# Patient Record
Sex: Male | Born: 1985 | Race: White | Hispanic: No | Marital: Married | Smoking: Former smoker
Health system: Southern US, Community
[De-identification: ages and names within clinical notes are randomized; demographics above are authoritative.]

## PROBLEM LIST (undated history)

## (undated) DIAGNOSIS — B019 Varicella without complication: Secondary | ICD-10-CM

## (undated) HISTORY — DX: Hemochromatosis, unspecified: E83.119

## (undated) HISTORY — DX: Varicella without complication: B01.9

## (undated) HISTORY — PX: KNEE SURGERY: SHX244

## (undated) HISTORY — PX: HAND SURGERY: SHX662

---
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
## 2006-11-03 ENCOUNTER — Emergency Department (HOSPITAL_COMMUNITY): Admission: EM | Admit: 2006-11-03 | Discharge: 2006-11-03 | Payer: Self-pay | Admitting: Emergency Medicine

## 2006-11-23 ENCOUNTER — Ambulatory Visit (HOSPITAL_COMMUNITY): Admission: RE | Admit: 2006-11-23 | Discharge: 2006-11-23 | Payer: Self-pay | Admitting: Orthopedic Surgery

## 2011-09-18 ENCOUNTER — Emergency Department (HOSPITAL_BASED_OUTPATIENT_CLINIC_OR_DEPARTMENT_OTHER)
Admission: EM | Admit: 2011-09-18 | Discharge: 2011-09-19 | Disposition: A | Discharge status: Home / Self Care | Payer: 59 | Attending: Emergency Medicine | Admitting: Emergency Medicine

## 2011-09-18 ENCOUNTER — Encounter: Payer: Self-pay | Admitting: *Deleted

## 2011-09-18 DIAGNOSIS — R1031 Right lower quadrant pain: Secondary | ICD-10-CM | POA: Insufficient documentation

## 2011-09-18 DIAGNOSIS — R110 Nausea: Secondary | ICD-10-CM | POA: Insufficient documentation

## 2011-09-18 MED ORDER — SODIUM CHLORIDE 0.9 % IV SOLN
Freq: Once | INTRAVENOUS | Status: AC
  Administered 2011-09-19: via INTRAVENOUS

## 2011-09-18 NOTE — ED Provider Notes (Signed)
Scribed for [name], MD, the patient was seen in room MH05/MH05 . This chart was scribed by Ellie Lunch.   CSN: [id number] Arrival date & time: 09/18/2011 11:40 PM   First MD Initiated Contact with Patient 09/18/11 2348      Chief Complaint  Patient presents with  . Abdominal Pain    (Consider location/radiation/quality/duration/timing/severity/associated sxs/prior treatment) Patient is a 25 y.o. male presenting with abdominal pain. The history is provided by the patient. No language interpreter was used.  Abdominal Pain The primary symptoms of the illness include abdominal pain and nausea. The primary symptoms of the illness do not include fever, shortness of breath, vomiting, diarrhea or dysuria. The current episode started more than 2 days ago. The onset of the illness was gradual. The problem has been gradually worsening.  The abdominal pain is located in the RLQ. The abdominal pain does not radiate. Pain scale: at worst 10/10 currently 2/10. The abdominal pain is relieved by nothing.  Symptoms associated with the illness do not include chills or hematuria.   Pt seen at 11:50 PM Chase Roy is a 25 y.o. male who presents to the Emergency Department complaining of 3 days of diffuse abdominal pain that became focused at RLQ yesterday. Pain waxes and wanes. Currently 2/10 at worst on palpation 10/10. Described as stabbing pain at worst. C/o some nausea on palpation, deep breathing, or stretching. Denies vomiting, fever, chills, loss of appetite, hematuria, dysuria, or SOB. Pt denies any recent heavy lifting.   History reviewed. No pertinent past medical history.  Past Surgical History  Procedure Date  . Knee surgery   . Tonsillectomy   . Hand surgery     History reviewed. No pertinent family history.  History  Substance Use Topics  . Smoking status: Never Smoker   . Smokeless tobacco: Not on file  . Alcohol Use: Yes     Review of Systems  Constitutional:  Negative for fever and chills.  Respiratory: Negative for shortness of breath.   Gastrointestinal: Positive for nausea and abdominal pain. Negative for vomiting and diarrhea.  Genitourinary: Negative for dysuria and hematuria.  All other systems reviewed and are negative.    Allergies  Review of patient's allergies indicates not on file.  Home Medications  No current outpatient prescriptions on file.  BP 151/97  Pulse 72  Temp(Src) 98.1 F (36.7 C) (Oral)  Resp 18  Ht 5\' 8"  (1.727 m)  Wt 155 lb (70.308 kg)  BMI 23.57 kg/m2  SpO2 99%  Physical Exam  Nursing note and vitals reviewed. Constitutional: He is oriented to person, place, and time. He appears well-developed and well-nourished.  HENT:  Head: Normocephalic and atraumatic.  Eyes: EOM are normal. Pupils are equal, round, and reactive to light.  Neck: Normal range of motion. Neck supple.  Cardiovascular: Normal rate, regular rhythm, normal heart sounds and intact distal pulses.   Pulmonary/Chest: Effort normal and breath sounds normal. No respiratory distress.  Abdominal: Soft. Bowel sounds are normal. There is tenderness. Rebound: moderate to severe RLQ tenderness.  Musculoskeletal: Normal range of motion.  Neurological: He is alert and oriented to person, place, and time.  Skin: Skin is warm and dry.  Psychiatric: He has a normal mood and affect.    ED Course  Procedures (including critical care time) OTHER DATA REVIEWED: Nursing notes, vital signs, and past medical records reviewed.   DIAGNOSTIC STUDIES: Oxygen Saturation is 99% on room air, normal by my interpretation.    Results for  orders placed during the hospital encounter of 09/18/11  URINALYSIS, ROUTINE W REFLEX MICROSCOPIC      Component Value Range   Color, Urine YELLOW  YELLOW    Appearance CLOUDY (*) CLEAR    Specific Gravity, Urine 1.022  1.005 - 1.030    pH 6.5  5.0 - 8.0    Glucose, UA NEGATIVE  NEGATIVE (mg/dL)   Hgb urine dipstick  NEGATIVE  NEGATIVE    Bilirubin Urine NEGATIVE  NEGATIVE    Ketones, ur NEGATIVE  NEGATIVE (mg/dL)   Protein, ur NEGATIVE  NEGATIVE (mg/dL)   Urobilinogen, UA 0.2  0.0 - 1.0 (mg/dL)   Nitrite NEGATIVE  NEGATIVE    Leukocytes, UA NEGATIVE  NEGATIVE   CBC      Component Value Range   WBC 7.2  4.0 - 10.5 (K/uL)   RBC 5.37  4.22 - 5.81 (MIL/uL)   Hemoglobin 16.9  13.0 - 17.0 (g/dL)   HCT 45.4  09.8 - 11.9 (%)   MCV 85.7  78.0 - 100.0 (fL)   MCH 31.5  26.0 - 34.0 (pg)   MCHC 36.7 (*) 30.0 - 36.0 (g/dL)   RDW 14.7  82.9 - 56.2 (%)   Platelets 159  150 - 400 (K/uL)  DIFFERENTIAL      Component Value Range   Neutrophils Relative 37 (*) 43 - 77 (%)   Neutro Abs 2.7  1.7 - 7.7 (K/uL)   Lymphocytes Relative 50 (*) 12 - 46 (%)   Lymphs Abs 3.6  0.7 - 4.0 (K/uL)   Monocytes Relative 10  3 - 12 (%)   Monocytes Absolute 0.8  0.1 - 1.0 (K/uL)   Eosinophils Relative 2  0 - 5 (%)   Eosinophils Absolute 0.1  0.0 - 0.7 (K/uL)   Basophils Relative 0  0 - 1 (%)   Basophils Absolute 0.0  0.0 - 0.1 (K/uL)  COMPREHENSIVE METABOLIC PANEL      Component Value Range   Sodium 139  135 - 145 (mEq/L)   Potassium 3.3 (*) 3.5 - 5.1 (mEq/L)   Chloride 101  96 - 112 (mEq/L)   CO2 29  19 - 32 (mEq/L)   Glucose, Bld 85  70 - 99 (mg/dL)   BUN 15  6 - 23 (mg/dL)   Creatinine, Ser 1.30  0.50 - 1.35 (mg/dL)   Calcium 86.5  8.4 - 10.5 (mg/dL)   Total Protein 7.8  6.0 - 8.3 (g/dL)   Albumin 4.4  3.5 - 5.2 (g/dL)   AST 25  0 - 37 (U/L)   ALT 49  0 - 53 (U/L)   Alkaline Phosphatase 104  39 - 117 (U/L)   Total Bilirubin 0.4  0.3 - 1.2 (mg/dL)   GFR calc non Af Amer >90  >90 (mL/min)   GFR calc Af Amer >90  >90 (mL/min)   Ct Abdomen Pelvis W Contrast  09/19/2011  *RADIOLOGY REPORT*  Clinical Data: Right lower quadrant abdominal pain and nausea.  CT ABDOMEN AND PELVIS WITH CONTRAST  Technique:  Multidetector CT imaging of the abdomen and pelvis was performed following the standard protocol during bolus  administration of intravenous contrast.  Contrast: OMNIPAQUE IOHEXOL 300 MG/ML IV SOLN  Comparison: None.  Findings: The visualized lung bases are clear.  The liver and spleen are unremarkable in appearance.  The gallbladder is largely decompressed and is within normal limits. The pancreas and adrenal glands are unremarkable.  The kidneys are unremarkable in appearance.  There is no  evidence of hydronephrosis.  No renal or ureteral stones are seen.  No perinephric stranding is appreciated.  No free fluid is identified.  The small bowel is unremarkable in appearance.  The stomach is filled with contrast and is within normal limits.  No acute vascular abnormalities are seen.  The appendix is normal in caliber and contains air, without evidence for appendicitis.  There is trace fluid and mild focal soft tissue inflammation along the right paracolic gutter, at the level of the proximal ascending colon.  No associated findings are seen; this could reflect a poorly characterized epiploic appendagitis or perhaps a small omental infarct.  The colon is partially filled with stool and is otherwise unremarkable in appearance.  The bladder is mildly distended and within normal limits.  The prostate remains normal in size.  No inguinal lymphadenopathy is seen.  No acute osseous abnormalities are identified.  IMPRESSION:  1.  No evidence of appendicitis. 2.  Trace fluid and mild focal soft tissue inflammation noted along the right paracolic gutter, at the level of the proximal ascending colon.  No additional findings seen; this could reflect a poorly characterized epiploic appendagitis or perhaps a small omental infarct.  Original Report Authenticated By: Tonia Ghent, M.D.     11:55 PM EDP at PT bedside. Discussed plan to CT abdomen to rule out appendicitis.   ED MEDICATIONS Medications  0.9 %  sodium chloride infusion       MDM  2:06 AM Advised patient of CT findings. He declines prescription for  analgesics. He was advised to return for worsening symptoms.  I personally performed the services described in this documentation, which was scribed in my presence.  The recorded information has been reviewed and considered.        [name], MD 09/19/11 [phone]

## 2011-09-18 NOTE — ED Notes (Signed)
Pt describes RUQ pain for a couple days. Worse with palp and movement. +nausea only when area is palp.

## 2011-09-19 ENCOUNTER — Emergency Department (INDEPENDENT_AMBULATORY_CARE_PROVIDER_SITE_OTHER): Payer: 59

## 2011-09-19 DIAGNOSIS — R110 Nausea: Secondary | ICD-10-CM

## 2011-09-19 DIAGNOSIS — R1031 Right lower quadrant pain: Secondary | ICD-10-CM

## 2011-09-19 LAB — CBC
HCT: 46 % (ref 39.0–52.0)
Hemoglobin: 16.9 g/dL (ref 13.0–17.0)
MCH: 31.5 pg (ref 26.0–34.0)
MCHC: 36.7 g/dL — ABNORMAL HIGH (ref 30.0–36.0)
MCV: 85.7 fL (ref 78.0–100.0)
Platelets: 159 10*3/uL (ref 150–400)
RBC: 5.37 MIL/uL (ref 4.22–5.81)
RDW: 12 % (ref 11.5–15.5)
WBC: 7.2 10*3/uL (ref 4.0–10.5)

## 2011-09-19 LAB — DIFFERENTIAL
Basophils Absolute: 0 10*3/uL (ref 0.0–0.1)
Basophils Relative: 0 % (ref 0–1)
Eosinophils Absolute: 0.1 10*3/uL (ref 0.0–0.7)
Eosinophils Relative: 2 % (ref 0–5)
Lymphocytes Relative: 50 % — ABNORMAL HIGH (ref 12–46)
Lymphs Abs: 3.6 10*3/uL (ref 0.7–4.0)
Monocytes Absolute: 0.8 10*3/uL (ref 0.1–1.0)
Monocytes Relative: 10 % (ref 3–12)
Neutro Abs: 2.7 10*3/uL (ref 1.7–7.7)
Neutrophils Relative %: 37 % — ABNORMAL LOW (ref 43–77)

## 2011-09-19 LAB — URINALYSIS, ROUTINE W REFLEX MICROSCOPIC
Bilirubin Urine: NEGATIVE
Glucose, UA: NEGATIVE mg/dL
Hgb urine dipstick: NEGATIVE
Ketones, ur: NEGATIVE mg/dL
Leukocytes, UA: NEGATIVE
Nitrite: NEGATIVE
Protein, ur: NEGATIVE mg/dL
Specific Gravity, Urine: 1.022 (ref 1.005–1.030)
Urobilinogen, UA: 0.2 mg/dL (ref 0.0–1.0)
pH: 6.5 (ref 5.0–8.0)

## 2011-09-19 LAB — COMPREHENSIVE METABOLIC PANEL
ALT: 49 U/L (ref 0–53)
AST: 25 U/L (ref 0–37)
Albumin: 4.4 g/dL (ref 3.5–5.2)
Alkaline Phosphatase: 104 U/L (ref 39–117)
Chloride: 101 mEq/L (ref 96–112)
Potassium: 3.3 mEq/L — ABNORMAL LOW (ref 3.5–5.1)
Sodium: 139 mEq/L (ref 135–145)
Total Bilirubin: 0.4 mg/dL (ref 0.3–1.2)
Total Protein: 7.8 g/dL (ref 6.0–8.3)

## 2011-09-19 MED ORDER — IOHEXOL 300 MG/ML  SOLN
100.0000 mL | Freq: Once | INTRAMUSCULAR | Status: AC | PRN
  Administered 2011-09-19: 100 mL via INTRAVENOUS

## 2011-09-19 NOTE — ED Notes (Signed)
Transported to radiology 

## 2011-09-20 ENCOUNTER — Ambulatory Visit (INDEPENDENT_AMBULATORY_CARE_PROVIDER_SITE_OTHER): Payer: 59 | Admitting: Family Medicine

## 2011-09-20 ENCOUNTER — Encounter: Payer: Self-pay | Admitting: Family Medicine

## 2011-09-20 DIAGNOSIS — Z0000 Encounter for general adult medical examination without abnormal findings: Secondary | ICD-10-CM | POA: Insufficient documentation

## 2011-09-20 NOTE — Patient Instructions (Signed)
Continue your current good health habits.  Check your blood pressure monthly since high blood pressure runs in the family.  Return every 3 years for general physical examination sooner if any problems.  I will call you when I get y  lab work

## 2011-09-20 NOTE — Progress Notes (Signed)
  Subjective:    Patient ID: Chase Roy, male    DOB: 08-08-1986, 25 y.o.   MRN: [id number]  HPIDustin is a 25 year old, married male, nonsmoker, works at [employer], who comes in today as a new patient.  As always been in excellent health.  Has had no chronic health problems.  He had a tonsillectomy as a child, a torn cartilage in his right knee, which was repaired as a 9-year-old, a cut on his left arm that required.  We surgery.  Otherwise, his Xanax will help him no problems.  Review of systems negative except thator 2008  Family history both mother and father have hypertension and his mother has a gene for hemachromatosis and he would like to be checked to see if he has that gene  also.    Review of Systems  Constitutional: Negative.   HENT: Negative.   Eyes: Negative.   Respiratory: Negative.   Cardiovascular: Negative.   Gastrointestinal: Negative.   Genitourinary: Negative.   Musculoskeletal: Negative.   Skin: Negative.   Neurological: Negative.   Hematological: Negative.   Psychiatric/Behavioral: Negative.        Objective:   Physical Exam  Constitutional: He is oriented to person, place, and time. He appears well-developed and well-nourished.  HENT:  Head: Normocephalic and atraumatic.  Right Ear: External ear normal.  Left Ear: External ear normal.  Nose: Nose normal.  Mouth/Throat: Oropharynx is clear and moist.  Eyes: Conjunctivae and EOM are normal. Pupils are equal, round, and reactive to light.  Neck: Normal range of motion. Neck supple. No JVD present. No tracheal deviation present. No thyromegaly present.  Cardiovascular: Normal rate, regular rhythm, normal heart sounds and intact distal pulses.  Exam reveals no gallop and no friction rub.   No murmur heard. Pulmonary/Chest: Effort normal and breath sounds normal. No stridor. No respiratory distress. He has no wheezes. He has no rales. He exhibits no tenderness.  Abdominal: Soft. Bowel sounds  are normal. He exhibits no distension and no mass. There is no tenderness. There is no rebound and no guarding.  Genitourinary: Penis normal. No penile tenderness.  Musculoskeletal: Normal range of motion. He exhibits no edema and no tenderness.  Lymphadenopathy:    He has no cervical adenopathy.  Neurological: He is alert and oriented to person, place, and time. He has normal reflexes. No cranial nerve deficit. He exhibits normal muscle tone.  Skin: Skin is warm and dry. No rash noted. No erythema. No pallor.  Psychiatric: He has a normal mood and affect. His behavior is normal. Judgment and thought content normal.          Assessment & Plan:  Healthy male.  Family history of hemachromatosis........Marland Kitchen Mother.......... Screen for iron storage disease

## 2011-09-21 LAB — CBC WITH DIFFERENTIAL/PLATELET
Basophils Absolute: 0 10*3/uL (ref 0.0–0.1)
Basophils Relative: 0.5 % (ref 0.0–3.0)
Eosinophils Absolute: 0.1 10*3/uL (ref 0.0–0.7)
HCT: 50.3 % (ref 39.0–52.0)
Hemoglobin: 17.3 g/dL — ABNORMAL HIGH (ref 13.0–17.0)
Lymphocytes Relative: 42.2 % (ref 12.0–46.0)
Lymphs Abs: 1.7 10*3/uL (ref 0.7–4.0)
MCHC: 34.3 g/dL (ref 30.0–36.0)
Monocytes Relative: 10.8 % (ref 3.0–12.0)
Neutro Abs: 1.8 10*3/uL (ref 1.4–7.7)
RBC: 5.38 Mil/uL (ref 4.22–5.81)
RDW: 12.6 % (ref 11.5–14.6)

## 2011-09-21 LAB — LIPID PANEL
LDL Cholesterol: 103 mg/dL — ABNORMAL HIGH (ref 0–99)
Total CHOL/HDL Ratio: 4
VLDL: 31 mg/dL (ref 0.0–40.0)

## 2011-09-21 LAB — BASIC METABOLIC PANEL
BUN: 13 mg/dL (ref 6–23)
Chloride: 103 mEq/L (ref 96–112)
Creatinine, Ser: 0.9 mg/dL (ref 0.4–1.5)
Glucose, Bld: 73 mg/dL (ref 70–99)
Potassium: 3.9 mEq/L (ref 3.5–5.1)

## 2011-09-21 LAB — HEPATIC FUNCTION PANEL
Alkaline Phosphatase: 79 U/L (ref 39–117)
Bilirubin, Direct: 0.1 mg/dL (ref 0.0–0.3)

## 2011-09-21 LAB — IBC PANEL: Saturation Ratios: 60.8 % — ABNORMAL HIGH (ref 20.0–50.0)

## 2011-09-21 LAB — FERRITIN: Ferritin: 337.9 ng/mL — ABNORMAL HIGH (ref 22.0–322.0)

## 2011-09-21 LAB — IRON: Iron: 195 ug/dL — ABNORMAL HIGH (ref 42–165)

## 2011-09-27 ENCOUNTER — Telehealth: Payer: Self-pay | Admitting: Family Medicine

## 2011-09-27 DIAGNOSIS — R799 Abnormal finding of blood chemistry, unspecified: Secondary | ICD-10-CM

## 2011-09-27 NOTE — Telephone Encounter (Signed)
Pt rcvd letter stating he needs to office. Pls call pt at earliest convenience.

## 2011-09-27 NOTE — Telephone Encounter (Signed)
Tried to call but voice mail is not set up yet

## 2011-09-29 NOTE — Telephone Encounter (Signed)
Tried to call but mailbox not set up yet.

## 2011-10-05 NOTE — Telephone Encounter (Signed)
Pt called and said that he needs to get a referral to spec re: Hemachromatosis. Pt said that he is setting up his vm, so messages can now be lft on his phone.

## 2011-10-05 NOTE — Telephone Encounter (Signed)
Patient is aware of lab results.

## 2011-10-11 NOTE — Telephone Encounter (Signed)
ok 

## 2011-10-13 NOTE — Telephone Encounter (Signed)
Refer sent and patient is aware

## 2011-10-19 ENCOUNTER — Ambulatory Visit (HOSPITAL_COMMUNITY): Payer: 59 | Admitting: Oncology

## 2011-11-03 ENCOUNTER — Encounter (HOSPITAL_COMMUNITY): Payer: Self-pay | Admitting: Oncology

## 2011-11-03 ENCOUNTER — Encounter (HOSPITAL_COMMUNITY): Payer: 59 | Attending: Oncology | Admitting: Oncology

## 2011-11-03 DIAGNOSIS — Z1589 Genetic susceptibility to other disease: Secondary | ICD-10-CM | POA: Insufficient documentation

## 2011-11-03 DIAGNOSIS — Z862 Personal history of diseases of the blood and blood-forming organs and certain disorders involving the immune mechanism: Secondary | ICD-10-CM

## 2011-11-03 DIAGNOSIS — Z832 Family history of diseases of the blood and blood-forming organs and certain disorders involving the immune mechanism: Secondary | ICD-10-CM | POA: Insufficient documentation

## 2011-11-03 NOTE — Patient Instructions (Signed)
[facility]  Discharge Instructions Chase Roy  [id number] 1986-02-08   RECOMMENDATIONS MADE BY THE CONSULTANT AND ANY TEST RESULTS WILL BE SENT TO YOUR REFERRING DOCTOR.   EXAM FINDINGS BY MD TODAY AND SIGNS AND SYMPTOMS TO REPORT TO CLINIC OR PRIMARY MD: Exam findings as discussed by Dr. Mariel Sleet.  We will contact you if any abnormal lab results - feel free to call us on 11/04/11 to find out what your results are.  Otherwise, we will see you back in the office in 4 months if needed.   I acknowledge that I have been informed and understand all the instructions given to me and received a copy. I do not have any more questions at this time, but understand that I may call the Specialty Clinic at [facility] at [phone] during business hours should I have any further questions or need assistance in obtaining follow-up care.    __________________________________________  _____________  __________ Signature of Patient or Authorized Representative            Date                   Time    __________________________________________ Nurse's Signature

## 2011-11-03 NOTE — Progress Notes (Signed)
CC:   Tinnie Gens A. Tawanna Cooler, MD  DIAGNOSES: 1. 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 2. History of tonsillectomy. 3. History of repaired left thumb from trauma. 4. History of knee arthroscopic surgery on the left as a teenager.  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 His mother is a single gene carrier.  His personal history is otherwise negative.  He feels good and is fully active.  No fever, chills, night sweats, etc.  SOCIAL HISTORY:  He and his wife have been married several years.  They have a son who just born about 3 months ago.  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  He does not smoke at all.  He drinks socially, once to twice a month, he states.  His sense of well-being is quite good.  He is not aware of anything new or different.  REVIEW OF SYSTEMS:  Otherwise, noncontributory.  FAMILY HISTORY:  His father is in good health.  His father has never been tested for the gene.  With the sister having hemochromatosis, it appears that both parents probably have at least 1 of the genes.  PHYSICAL EXAMINATION TODAY:  Vital Signs:  Height 5 feet and 7-1/2 inches.  Weight is 161 pounds.  BMI 24.9.  Blood pressure 140/90 in the left arm sitting position.  Pulse 80 and regular.  Respirations 16 and unlabored.  He is afebrile.  General Appearance:  He has no pain.  Lymph Nodes:  Negative throughout.  Lungs:  He has clear lung fields to auscultation and percussion.  He has no thyromegaly.  Teeth:  He has a little bit of extra material at the base of the gums, probably because he does not use dental floss.  I have encouraged him to use it daily. Abdomen:  Soft and nontender without hepatosplenomegaly or masses. Heart:  Regular rhythm and rate without murmur, rub, or gallop. Extremities:  His left  thumb has a scar on it which is well healed.  He has no peripheral edema.  Neurologic:  He is alert and oriented.  LABORATORY DATA:  Dr. Tawanna Cooler has picked up some lab work which is suggestive of possible hemochromatosis, but I am hoping because the ferritin is just barely above the upper limits of normal that he only has a single carrier state.  His laboratory work that was collected by Dr. Nelida Meuse office on the 12th of November, however, does show a serum iron of 195, a TIBC of 229, and a percent saturation is 60.8%.  The ferritin was only 337 (upper limits of normal 322).  Hemoglobin 17.3, white count slightly low at 4,000, interestingly as it was normal just a day before.  Platelets are normal at 152,000.  The differential was unremarkable on the 11th of November.  He has had an unremarkable urinalysis as well in November.  CMET shows normal liver enzymes, normal BUN, normal creatinine.  The potassium was slightly low at 3.3 and repeat on the 12th was normal at 3.9.  PLAN:  We do need to check him for the hemochromatosis gene rearrangement and will do that.  He is in agreement to be tested for that.  We will then be in contact with him as to what to do further.  If he only has a single gene, I have recommended that he just donate blood 2 or 3  times a year.  If he has the homozygous state, he will need a more aggressive donation program.  I am hoping with his ferritin in the range that is that he, again, is only a single gene carrier.    ______________________________ Chase Horns. Mariel Sleet, MD ESN/MEDQ  D:  11/03/2011  T:  11/03/2011  Job:  [id number]

## 2011-11-03 NOTE — Progress Notes (Signed)
Chase Roy presented for [employer]. Labs per MD order drawn via Peripheral Line 23 gauge needle inserted in left AC.  Good blood return present. Procedure without incident.  Needle removed intact. Patient tolerated procedure well.

## 2011-11-03 NOTE — Progress Notes (Signed)
This office note has been dictated.

## 2011-11-10 ENCOUNTER — Other Ambulatory Visit (HOSPITAL_COMMUNITY): Payer: Self-pay | Admitting: Oncology

## 2011-11-10 NOTE — Progress Notes (Signed)
This office note has been dictated.

## 2011-11-15 NOTE — Progress Notes (Signed)
CC:   Tinnie Gens A. 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  I talked to him today.  He needs to donate blood every 8 weeks.  Will check a ferritin in 6 months.  Will go over more details about avoiding iron excess, cooking with iron skillets, etc., except on a rare occasion at that point in time, but I will see him after the lab in 6 months which is just going to be a ferritin.  He will try to donate a unit of blood every 8 weeks for 3 times between now and the blood test.    ______________________________ Ladona Horns. Mariel Sleet, MD ESN/MEDQ  D:  11/10/2011  T:  11/10/2011  Job:  [id number]

## 2012-03-03 ENCOUNTER — Ambulatory Visit (HOSPITAL_COMMUNITY): Payer: 59 | Admitting: Oncology

## 2012-05-01 ENCOUNTER — Ambulatory Visit (HOSPITAL_COMMUNITY): Payer: 59

## 2012-05-03 ENCOUNTER — Ambulatory Visit (HOSPITAL_COMMUNITY): Payer: 59 | Admitting: Oncology

## 2012-10-11 ENCOUNTER — Ambulatory Visit (HOSPITAL_COMMUNITY): Payer: 59 | Admitting: Oncology

## 2013-06-25 ENCOUNTER — Encounter (HOSPITAL_COMMUNITY): Payer: 59 | Attending: Hematology and Oncology

## 2013-06-25 LAB — CBC
Hemoglobin: 17.5 g/dL — ABNORMAL HIGH (ref 13.0–17.0)
Platelets: 169 10*3/uL (ref 150–400)
RBC: 5.53 MIL/uL (ref 4.22–5.81)
WBC: 4.7 10*3/uL (ref 4.0–10.5)

## 2013-06-25 NOTE — Patient Instructions (Addendum)
[facility] Discharge Instructions  RECOMMENDATIONS MADE BY THE CONSULTANT AND ANY TEST RESULTS WILL BE SENT TO YOUR REFERRING PHYSICIAN.  EXAM FINDINGS BY THE PHYSICIAN TODAY AND SIGNS OR SYMPTOMS TO REPORT TO CLINIC OR PRIMARY PHYSICIAN: Exam and discussion by Dr. Sharia Reeve.  We need to keep check on your blood counts and iron levels to make sure you are getting enough phlebotomies.  MEDICATIONS PRESCRIBED:  none  INSTRUCTIONS GIVEN AND DISCUSSED: Blood work every 12 weeks.  SPECIAL INSTRUCTIONS/FOLLOW-UP: Blood work today, in 12 weeks and follow-up after blood work in 3 months.  Thank you for choosing [facility] to provide your oncology and hematology care.  To afford each patient quality time with our providers, please arrive at least 15 minutes before your scheduled appointment time.  With your help, our goal is to use those 15 minutes to complete the necessary work-up to ensure our physicians have the information they need to help with your evaluation and healthcare recommendations.    Effective January 1st, 2014, we ask that you re-schedule your appointment with our physicians should you arrive 10 or more minutes late for your appointment.  We strive to give you quality time with our providers, and arriving late affects you and other patients whose appointments are after yours.    Again, thank you for choosing [facility].  Our hope is that these requests will decrease the amount of time that you wait before being seen by our physicians.       _____________________________________________________________  Should you have questions after your visit to [facility], please contact our office at [phone] between the hours of 8:30 a.m. and 5:00 p.m.  Voicemails left after 4:30 p.m. will not be returned until the following business day.  For prescription refill requests, have your pharmacy contact our office with your  prescription refill request.

## 2013-06-25 NOTE — Progress Notes (Signed)
      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 male returns to the clinic today for Scheduled follow up after an interval of about 2 years so no follow up.  Patient give various personal reasons for not come in for follow up including job related to reasons.   He tells me he goes to donate blood to the [facility] every 2 months and that they are  aware of his condition of hemochromatosis.  He tells me that the last time he donated blood was 2 months ago. His last iron studies was in November 2012 which showed a ferritin of 338, iron saturation of 61 at that time.  Patient states that he feels well and has no complaints at this time.    MEDICAL HISTORY:No past medical history on file.  ALLERGIES:  has No Known Allergies.   REVIEW OF SYSTEMS: 14 point review of system is as in the history above otherwise negative.  PHYSICAL EXAMINATION:  Blood pressure 132/90, pulse 66, resp. rate 20, weight 163 lb 8 oz (74.163 kg). GENERAL: No acute distress. SKIN:  No rashes or significant lesions . No ecchymosis or petechial rash. HEAD: Normocephalic, No masses, lesions, tenderness or abnormalities  EYES: Conjunctiva are pink and non-injected and no jaundice LUNGS: Clear to auscultation , no crackles or wheezes HEART: regular rate & rhythm, no murmurs, no gallops, S1 normal and S2 normal and no S3. ABDOMEN: Abdomen soft, non-tender, no masses or organomegaly and no hepatosplenomegaly palpable EXTREMITIES: No edema, no skin discoloration or tenderness   LABORATORY DATA: Iron studies today pending.    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  Reportedly he goes to the [employer]  reportedly to donate blood  every 2 months. He has no recent iron study. Had a very long discussion with the patient and explained to him the need to make sure his iron studies and meet this stated target to prevent organ damage which most cases are not reversible.  He verbalized understanding and promised to be more compliant with follow ups.   PLAN:  1.I will obtain iron studies today. 2.Target ferritin will be about 50 ng /ml 3. He will return to clinic in 3 months.  I ordered 36-monthly iron studies.     All questions were satisfactorily answered. Patient knows to call if  any concern arises.  I spent more than 50 % counseling the patient face to face. The total time spent in the appointment was 30 minutes.   Sherral Hammers, MD FACP. Hematology/Oncology.

## 2013-06-25 NOTE — Progress Notes (Signed)
Chase Roy presented for [employer]. Labs per MD order drawn via Peripheral Line 23 gauge needle inserted in right AC  Good blood return present. Procedure without incident.  Needle removed intact. Patient tolerated procedure well.

## 2013-06-26 ENCOUNTER — Other Ambulatory Visit (HOSPITAL_COMMUNITY): Payer: Self-pay | Admitting: Hematology and Oncology

## 2013-06-26 LAB — IRON AND TIBC
Iron: 214 ug/dL — ABNORMAL HIGH (ref 42–135)
Saturation Ratios: 75 % — ABNORMAL HIGH (ref 20–55)
TIBC: 284 ug/dL (ref 215–435)
UIBC: 70 ug/dL — ABNORMAL LOW (ref 125–400)

## 2013-06-26 LAB — FERRITIN: Ferritin: 204 ng/mL (ref 22–322)

## 2013-06-29 ENCOUNTER — Encounter (HOSPITAL_BASED_OUTPATIENT_CLINIC_OR_DEPARTMENT_OTHER): Payer: 59

## 2013-06-29 NOTE — Progress Notes (Signed)
Chase Roy presents today for phlebotomy per MD orders. Phlebotomy procedure started at 0848 and ended at 0902. 500cc removed. Patient tolerated procedure well. IV needle removed intact.

## 2013-07-11 ENCOUNTER — Encounter (HOSPITAL_COMMUNITY): Payer: 59 | Attending: Hematology and Oncology

## 2013-07-11 ENCOUNTER — Other Ambulatory Visit (HOSPITAL_COMMUNITY): Payer: 59

## 2013-07-11 ENCOUNTER — Encounter (HOSPITAL_COMMUNITY): Payer: 59

## 2013-07-11 LAB — CBC
MCH: 32.7 pg (ref 26.0–34.0)
MCHC: 36.8 g/dL — ABNORMAL HIGH (ref 30.0–36.0)
Platelets: 177 10*3/uL (ref 150–400)
RDW: 12.4 % (ref 11.5–15.5)

## 2013-07-12 ENCOUNTER — Encounter (HOSPITAL_BASED_OUTPATIENT_CLINIC_OR_DEPARTMENT_OTHER): Payer: 59

## 2013-07-12 LAB — IRON AND TIBC
Iron: 190 ug/dL — ABNORMAL HIGH (ref 42–135)
UIBC: 110 ug/dL — ABNORMAL LOW (ref 125–400)

## 2013-07-12 LAB — FERRITIN: Ferritin: 105 ng/mL (ref 22–322)

## 2013-07-12 NOTE — Progress Notes (Signed)
Chase Roy presents today for phlebotomy per MD orders. Phlebotomy procedure started at 0909 and ended at 0916.  500 cc removed. Patient tolerated procedure well. IV needle removed intact.

## 2013-07-13 NOTE — Progress Notes (Signed)
Message forwarded to Rosana Berger RN

## 2013-07-23 ENCOUNTER — Encounter (HOSPITAL_BASED_OUTPATIENT_CLINIC_OR_DEPARTMENT_OTHER): Payer: 59

## 2013-07-23 LAB — CBC
HCT: 44.1 % (ref 39.0–52.0)
Hemoglobin: 15.7 g/dL (ref 13.0–17.0)
MCHC: 35.6 g/dL (ref 30.0–36.0)
MCV: 89.8 fL (ref 78.0–100.0)

## 2013-07-23 LAB — IRON AND TIBC: TIBC: 302 ug/dL (ref 215–435)

## 2013-07-23 LAB — FERRITIN: Ferritin: 43 ng/mL (ref 22–322)

## 2013-07-23 NOTE — Progress Notes (Signed)
Labs drawn today for cbc,Iron and IBC,ferr 

## 2013-07-24 ENCOUNTER — Encounter (HOSPITAL_COMMUNITY): Payer: 59

## 2013-07-24 NOTE — Progress Notes (Signed)
Patient didn't need TPR today. Ferritin 43.

## 2013-08-06 ENCOUNTER — Encounter (HOSPITAL_BASED_OUTPATIENT_CLINIC_OR_DEPARTMENT_OTHER): Payer: 59

## 2013-08-06 LAB — CBC
HCT: 45.4 % (ref 39.0–52.0)
Platelets: 163 10*3/uL (ref 150–400)
RBC: 5.04 MIL/uL (ref 4.22–5.81)
RDW: 12.4 % (ref 11.5–15.5)
WBC: 3.3 10*3/uL — ABNORMAL LOW (ref 4.0–10.5)

## 2013-08-06 LAB — IRON AND TIBC
Saturation Ratios: 67 % — ABNORMAL HIGH (ref 20–55)
TIBC: 276 ug/dL (ref 215–435)
UIBC: 91 ug/dL — ABNORMAL LOW (ref 125–400)

## 2013-08-06 NOTE — Progress Notes (Signed)
Labs drawn today for cbc,Iron and IBC,ferr 

## 2013-08-08 ENCOUNTER — Encounter (HOSPITAL_COMMUNITY): Payer: 59

## 2013-08-21 ENCOUNTER — Encounter (HOSPITAL_COMMUNITY): Payer: 59 | Attending: Hematology and Oncology

## 2013-08-21 LAB — CBC WITH DIFFERENTIAL/PLATELET
Basophils Absolute: 0 10*3/uL (ref 0.0–0.1)
Eosinophils Absolute: 0.1 10*3/uL (ref 0.0–0.7)
Eosinophils Relative: 3 % (ref 0–5)
MCH: 32.3 pg (ref 26.0–34.0)
MCV: 88.7 fL (ref 78.0–100.0)
Platelets: 163 10*3/uL (ref 150–400)
RDW: 11.8 % (ref 11.5–15.5)
WBC: 4.1 10*3/uL (ref 4.0–10.5)

## 2013-08-21 LAB — IRON AND TIBC: Iron: 211 ug/dL — ABNORMAL HIGH (ref 42–135)

## 2013-08-21 NOTE — Progress Notes (Signed)
Labs drawn today for cbc/diff,Iron and IBC,ferr 

## 2013-09-14 ENCOUNTER — Other Ambulatory Visit (HOSPITAL_COMMUNITY): Payer: Self-pay | Admitting: *Deleted

## 2013-09-17 ENCOUNTER — Encounter (HOSPITAL_COMMUNITY): Payer: 59 | Attending: Hematology and Oncology

## 2013-09-17 LAB — CBC
HCT: 45.4 % (ref 39.0–52.0)
Hemoglobin: 16.6 g/dL (ref 13.0–17.0)
MCH: 32 pg (ref 26.0–34.0)
MCHC: 36.6 g/dL — ABNORMAL HIGH (ref 30.0–36.0)
MCV: 87.6 fL (ref 78.0–100.0)

## 2013-09-17 NOTE — Progress Notes (Signed)
Chase Roy presented for [employer]. Labs per MD order drawn via Peripheral Line 23 gauge needle inserted in RT AC  Good blood return present. Procedure without incident.  Needle removed intact. Patient tolerated procedure well.

## 2013-09-18 ENCOUNTER — Encounter (HOSPITAL_COMMUNITY): Payer: Self-pay

## 2013-09-18 ENCOUNTER — Encounter (HOSPITAL_BASED_OUTPATIENT_CLINIC_OR_DEPARTMENT_OTHER): Payer: 59

## 2013-09-18 ENCOUNTER — Encounter (HOSPITAL_COMMUNITY): Payer: 59

## 2013-09-18 LAB — FERRITIN: Ferritin: 49 ng/mL (ref 22–322)

## 2013-09-18 NOTE — Progress Notes (Signed)
Chase Roy presents today for phlebotomy per MD orders. Phlebotomy procedure started at 0932 and ended at 0944 500 cc removed. Patient tolerated procedure well. IV needle removed intact.  Patient drank 8oz of water during TPR. Discharged feeling "fine".

## 2013-09-18 NOTE — Patient Instructions (Addendum)
[facility] Discharge Instructions  RECOMMENDATIONS MADE BY THE CONSULTANT AND ANY TEST RESULTS WILL BE SENT TO YOUR REFERRING PHYSICIAN.  EXAM FINDINGS BY THE PHYSICIAN TODAY AND SIGNS OR SYMPTOMS TO REPORT TO CLINIC OR PRIMARY PHYSICIAN: Exam and findings as discussed by Dr. Delfina Redwood.  Will do phlebotomy today and will change labs to every 3 weeks with office visit in 8 weeks.  MEDICATIONS PRESCRIBED:  none  INSTRUCTIONS/FOLLOW-UP: Will check labs every 3 weeks and see you back in 8 weeks.  Thank you for choosing [facility] to provide your oncology and hematology care.  To afford each patient quality time with our providers, please arrive at least 15 minutes before your scheduled appointment time.  With your help, our goal is to use those 15 minutes to complete the necessary work-up to ensure our physicians have the information they need to help with your evaluation and healthcare recommendations.    Effective January 1st, 2014, we ask that you re-schedule your appointment with our physicians should you arrive 10 or more minutes late for your appointment.  We strive to give you quality time with our providers, and arriving late affects you and other patients whose appointments are after yours.    Again, thank you for choosing [facility].  Our hope is that these requests will decrease the amount of time that you wait before being seen by our physicians.       _____________________________________________________________  Should you have questions after your visit to [facility], please contact our office at [phone] between the hours of 8:30 a.m. and 5:00 p.m.  Voicemails left after 4:30 p.m. will not be returned until the following business day.  For prescription refill requests, have your pharmacy contact our office with your prescription refill request.     Therapeutic Phlebotomy Care After Refer to this sheet in the  next few weeks. These instructions provide you with information on caring for yourself after your procedure. Your caregiver may also give you more specific instructions. Your treatment has been planned according to current medical practices, but problems sometimes occur. Call your caregiver if you have any problems or questions after your procedure. HOME CARE INSTRUCTIONS Most people can go back to their normal activities right away. Before you leave, be sure to ask if there is anything you should or should not do. In general, it would be wise to:  Keep the bandage dry. You can remove the bandage after about 5 hours.  Eat well-balanced meals for the next 24 hours.  Drink enough fluids to keep your urine clear or pale yellow.  Avoid drinking alcohol minimally until after eating.  Avoid smoking for at least 30 minutes after the procedure.  Avoid strenous physical activity or heavy lifting or pulling for about 5 hours after the procedure.  Athletes should avoid strenous exercise for 12 hours or more.  Change positions slowly for the remainder of the day to prevent lightheadedness or fainting.  If you feel lightheaded, lie down until the feeling subsides.  If you have bleeding from the needle insertion site, elevate your arm and press firmly on the site until the bleeding stops.  If bruising or bleeding appears under the skin, apply ice to the area for 15 to 20 minutes, 3 to 4 times per day. Put the ice in a plastic bag and place a towel between the bag of ice and your skin. Do this while you are awake for  the first 24 hours. The ice packs can be stopped before 24 hours if the swelling goes away. If swelling persists after 24 hours, a warm, moist washcloth can be applied to the area for 15 to 20 minutes, 3 to 4 times per day. The warm, moist treatments can be stopped when the swelling goes away.  It is important to continue further therapeutic phlebotomy as directed by your caregiver. SEEK  MEDICAL CARE IF:  There is bleeding or fluid leaking from the needle insertion site.  The needle insertion site becomes swollen, red, or sore.  You feel lightheaded, dizzy or nauseated, and the feeling does not go away.  You notice new bruising at the needle insertion site.  You feel more weak or tired than normal.  You develop a fever. SEEK IMMEDIATE MEDICAL CARE IF:   There is increased bleeding, pain, or swelling from the needle insertion site.  You have severe nausea or vomiting.  You have chest pain.  You have trouble breathing. MAKE SURE YOU:  Understand these instructions.  Will watch your condition.  Will get help right away if you are not doing well or get worse. Document Released: 03/29/2011 Document Revised: 01/17/2012 Document Reviewed: 03/29/2011 [facility] Patient Information 2014 [place], [place].

## 2013-09-18 NOTE — Progress Notes (Signed)
[facility]  OFFICE PROGRESS NOTE  Chase Georges, MD [address] [place] [address]  DIAGNOSIS: Other hemochromatosis - Plan: Comprehensive metabolic panel, CANCELED: CBC with Differential, CANCELED: Ferritin, CANCELED: Iron and TIBC  Hemochromatosis - Plan: Phlebotomy therapeutic  Chief Complaint: Patient is here for  Therapeutic Phlebotomy    INTERVAL HISTORY: Chase Roy 27 y.o. male returns for F/u and for Therapeutic Phlebotomy. At present is being treated for bronchitis and is on antibiotics. He says his bronchitis is improving. He denies any headaches, dizziness, double vision, fevers, chills, night sweats, nausea, vomiting, diarrhea, constipation, chest pain, heart palpitations, shortness of breath, blood in stool, black tarry stool, urinary pain, urinary burning, urinary frequency, hematuria. He does not have any joint pains. He does not complain of any sexual dysfunction  MEDICAL HISTORY: Past Medical History  Diagnosis Date  . Upper respiratory infection of multiple sites     INTERIM HISTORY: has Routine general medical examination at a health care facility; Iron storage disease; and Hemochromatosis on his problem list.    ALLERGIES:  has No Known Allergies.  MEDICATIONS:  Current Outpatient Prescriptions  Medication Sig Dispense Refill  . Amoxicillin-Pot Clavulanate (AUGMENTIN PO) Take 1 capsule by mouth 2 (two) times daily.      . Ibuprofen (MOTRIN PO) Take by mouth as needed.       No current facility-administered medications for this visit.    SURGICAL HISTORY:  Past Surgical History  Procedure Laterality Date  . Knee surgery    . Hand surgery    . Tonsillectomy  1990    FAMILY HISTORY: family history includes Anemia in his sister; Hemochromatosis in his sister; Hypertension in his father and mother.  SOCIAL HISTORY:  reports that he quit smoking about 6 years ago. His smoking use  included Cigarettes. He smoked 0.00 packs per day. His smokeless tobacco use includes Snuff. He reports that he drinks alcohol. He reports that he does not use illicit drugs.  REVIEW OF SYSTEMS:  As mentioned in the history of present illness  PHYSICAL EXAMINATION: ECOG PERFORMANCE STATUS: 0 - Asymptomatic  Blood pressure 128/86, pulse 71, temperature 97.9 F (36.6 C), temperature source Oral, resp. rate 14, weight 164 lb 11.2 oz (74.707 kg).  GENERAL:alert, no distress, well nourished and well developed SKIN: no rashes or significant lesions HEAD: Normocephalic EYES: PERRLA, EOMI, Conjunctiva are pink and non-injected, sclera clear EARS: External ears normal OROPHARYNX:no erythema, lips, buccal mucosa, and tongue normal and mucous membranes are moist  NECK: supple, no adenopathy, no JVD, no stridor, non-tender LYMPH:  no palpable lymphadenopathy, no hepatosplenomegaly BREAST:breasts appear normal, no suspicious masses, no skin or nipple changes or axillary nodes LUNGS: clear to auscultation , coarse sounds heard HEART: regular rate & rhythm ABDOMEN:abdomen soft, obese and normal bowel sounds BACK: Back symmetric, no curvature. EXTREMITIES:no edema, no clubbing and no cyanosis  NEURO: alert & oriented x 3 with fluent speech, no focal motor/sensory deficits, gait normal   LABORATORY DATA: Infusion on 09/17/2013  Component Date Value Range Status  . WBC 09/17/2013 5.7  4.0 - 10.5 K/uL Final  . RBC 09/17/2013 5.18  4.22 - 5.81 MIL/uL Final  . Hemoglobin 09/17/2013 16.6  13.0 - 17.0 g/dL Final  . HCT 60/45/4098 45.4  39.0 - 52.0 % Final  . MCV 09/17/2013 87.6  78.0 - 100.0 fL Final  . MCH 09/17/2013 32.0  26.0 - 34.0 pg Final  . MCHC  09/17/2013 36.6* 30.0 - 36.0 g/dL Final  . RDW 04/54/0981 11.5  11.5 - 15.5 % Final  . Platelets 09/17/2013 201  150 - 400 K/uL Final  . Iron 09/17/2013 87  42 - 135 ug/dL Final  . TIBC 19/14/7829 289  215 - 435 ug/dL Final  . Saturation Ratios  09/17/2013 30  20 - 55 % Final  . UIBC 09/17/2013 202  125 - 400 ug/dL Final   Performed at [facility]  . Ferritin 09/17/2013 49  22 - 322 ng/mL Final   Performed at [facility] on 08/21/2013  Component Date Value Range Status  . WBC 08/21/2013 4.1  4.0 - 10.5 K/uL Final  . RBC 08/21/2013 5.32  4.22 - 5.81 MIL/uL Final  . Hemoglobin 08/21/2013 17.2* 13.0 - 17.0 g/dL Final  . HCT 56/21/3086 47.2  39.0 - 52.0 % Final  . MCV 08/21/2013 88.7  78.0 - 100.0 fL Final  . MCH 08/21/2013 32.3  26.0 - 34.0 pg Final  . MCHC 08/21/2013 36.4* 30.0 - 36.0 g/dL Final  . RDW 57/84/6962 11.8  11.5 - 15.5 % Final  . Platelets 08/21/2013 163  150 - 400 K/uL Final  . Neutrophils Relative % 08/21/2013 41* 43 - 77 % Final  . Neutro Abs 08/21/2013 1.7  1.7 - 7.7 K/uL Final  . Lymphocytes Relative 08/21/2013 43  12 - 46 % Final  . Lymphs Abs 08/21/2013 1.8  0.7 - 4.0 K/uL Final  . Monocytes Relative 08/21/2013 12  3 - 12 % Final  . Monocytes Absolute 08/21/2013 0.5  0.1 - 1.0 K/uL Final  . Eosinophils Relative 08/21/2013 3  0 - 5 % Final  . Eosinophils Absolute 08/21/2013 0.1  0.0 - 0.7 K/uL Final  . Basophils Relative 08/21/2013 1  0 - 1 % Final  . Basophils Absolute 08/21/2013 0.0  0.0 - 0.1 K/uL Final  . Iron 08/21/2013 211* 42 - 135 ug/dL Final  . TIBC 95/28/4132 301  215 - 435 ug/dL Final  . Saturation Ratios 08/21/2013 70* 20 - 55 % Final  . UIBC 08/21/2013 90* 125 - 400 ug/dL Final   Performed at [facility]  . 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 His CBCs are acceptable forphlebotomy today. We'll perform CMP CBC iron indices and ferritin level prior to next visit. F/u with todays labs.  PLAN: CBC, iron indices and ferritin level once every 3 weeks  Hold phlebotomy if hemoglobin of 12 or less than 12 g/dL/ferritin of less than 20  Next visit in 6 weeks  All questions were answered. The patient knows to call the clinic with any problems, questions or concerns. We can certainly see the patient much sooner if necessary.   I spent 15 minutes counseling the patient  face to face. The total time spent in the appointment was 25 minutes   Annamarie Dawley, MD 09/19/2013 8:52 AM

## 2013-10-08 ENCOUNTER — Encounter (HOSPITAL_COMMUNITY): Payer: 59 | Attending: Hematology and Oncology

## 2013-10-29 ENCOUNTER — Encounter (HOSPITAL_BASED_OUTPATIENT_CLINIC_OR_DEPARTMENT_OTHER): Payer: 59

## 2013-10-29 LAB — COMPREHENSIVE METABOLIC PANEL
BUN: 10 mg/dL (ref 6–23)
CO2: 28 mEq/L (ref 19–32)
Calcium: 9.3 mg/dL (ref 8.4–10.5)
Creatinine, Ser: 1.08 mg/dL (ref 0.50–1.35)
GFR calc Af Amer: >90 mL/min (ref >90)
GFR calc non Af Amer: >90 mL/min (ref >90)
Glucose, Bld: 72 mg/dL (ref 70–99)
Sodium: 139 mEq/L (ref 135–145)
Total Bilirubin: 0.7 mg/dL (ref 0.3–1.2)
Total Protein: 7.5 g/dL (ref 6.0–8.3)

## 2013-10-29 LAB — IRON AND TIBC
TIBC: 309 ug/dL (ref 215–435)
UIBC: 155 ug/dL (ref 125–400)

## 2013-10-29 LAB — CBC
HCT: 47.4 % (ref 39.0–52.0)
RDW: 12 % (ref 11.5–15.5)
WBC: 4.3 10*3/uL (ref 4.0–10.5)

## 2013-10-29 LAB — FERRITIN: Ferritin: 14 ng/mL — ABNORMAL LOW (ref 22–322)

## 2013-10-29 NOTE — Progress Notes (Signed)
Chase Roy's reason for visit today are for labs as scheduled per MD orders.  Venipuncture performed with a 23 gauge butterfly needle to R Antecubital.  Chase Roy tolerated venipuncture well and without incident; questions were answered and patient was discharged.

## 2013-11-12 ENCOUNTER — Ambulatory Visit (HOSPITAL_COMMUNITY): Payer: 59

## 2013-11-28 ENCOUNTER — Ambulatory Visit (HOSPITAL_COMMUNITY): Payer: 59

## 2013-12-05 ENCOUNTER — Ambulatory Visit (HOSPITAL_COMMUNITY): Payer: 59

## 2013-12-13 ENCOUNTER — Encounter (HOSPITAL_COMMUNITY): Payer: 59 | Attending: Hematology and Oncology

## 2013-12-13 ENCOUNTER — Telehealth (HOSPITAL_COMMUNITY): Payer: Self-pay

## 2013-12-13 ENCOUNTER — Encounter (HOSPITAL_BASED_OUTPATIENT_CLINIC_OR_DEPARTMENT_OTHER): Payer: 59

## 2013-12-13 ENCOUNTER — Encounter (HOSPITAL_COMMUNITY): Payer: Self-pay

## 2013-12-13 LAB — CBC WITH DIFFERENTIAL/PLATELET
BASOS PCT: 1 % (ref 0–1)
Basophils Absolute: 0 10*3/uL (ref 0.0–0.1)
EOS PCT: 2 % (ref 0–5)
Eosinophils Absolute: 0.1 10*3/uL (ref 0.0–0.7)
HCT: 47.5 % (ref 39.0–52.0)
Hemoglobin: 17.1 g/dL — ABNORMAL HIGH (ref 13.0–17.0)
Lymphocytes Relative: 44 % (ref 12–46)
Lymphs Abs: 2 10*3/uL (ref 0.7–4.0)
MCH: 31.1 pg (ref 26.0–34.0)
MCHC: 36 g/dL (ref 30.0–36.0)
MCV: 86.5 fL (ref 78.0–100.0)
Monocytes Absolute: 0.7 10*3/uL (ref 0.1–1.0)
Monocytes Relative: 15 % — ABNORMAL HIGH (ref 3–12)
Neutro Abs: 1.7 10*3/uL (ref 1.7–7.7)
Neutrophils Relative %: 38 % — ABNORMAL LOW (ref 43–77)
PLATELETS: 163 10*3/uL (ref 150–400)
RBC: 5.49 MIL/uL (ref 4.22–5.81)
RDW: 12.1 % (ref 11.5–15.5)
WBC: 4.5 10*3/uL (ref 4.0–10.5)

## 2013-12-13 LAB — IRON AND TIBC
Iron: 125 ug/dL (ref 42–135)
Saturation Ratios: 41 % (ref 20–55)
TIBC: 303 ug/dL (ref 215–435)
UIBC: 178 ug/dL (ref 125–400)

## 2013-12-13 LAB — FERRITIN: FERRITIN: 13 ng/mL — AB (ref 22–322)

## 2013-12-13 NOTE — Progress Notes (Signed)
[facility]  OFFICE PROGRESS NOTE  [name], MD [address] [place] [address]  DIAGNOSIS: Hemochromatosis - Plan: CBC with Differential, Ferritin, Iron and TIBC, CBC with Differential, Ferritin, Iron and TIBC  Chief Complaint  Patient presents with  . Hemochromatosis    CURRENT THERAPY: Intermittent phlebotomy  INTERVAL HISTORY: NIKLAUS MAMARIL 28 y.o. male returns for followup of hemochromatosis undergoing periodic phlebotomy to maintain low ferritin.  Last phlebotomy was performed about 2 months ago. Appetite has been good with no nausea, vomiting, pruritus, diarrhea, constipation, palpitations, PND, orthopnea, lower extremity swelling or redness, dark urine, polyuria, polydipsia, headache, or seizures.  MEDICAL HISTORY: Past Medical History  Diagnosis Date  . Upper respiratory infection of multiple sites     INTERIM HISTORY: has Routine general medical examination at a health care facility; Iron storage disease; and Hemochromatosis on his problem list.    ALLERGIES:  has No Known Allergies.  MEDICATIONS: has a current medication list which includes the following prescription(s): ibuprofen.  SURGICAL HISTORY:  Past Surgical History  Procedure Laterality Date  . Knee surgery    . Hand surgery    . Tonsillectomy  1990    FAMILY HISTORY: family history includes Anemia in his sister; Hemochromatosis in his sister; Hypertension in his father and mother.  SOCIAL HISTORY:  reports that he quit smoking about 7 years ago. His smoking use included Cigarettes. He smoked 0.00 packs per day. His smokeless tobacco use includes Snuff. He reports that he drinks alcohol. He reports that he does not use illicit drugs.  REVIEW OF SYSTEMS:  Other than that discussed above is noncontributory.  PHYSICAL EXAMINATION: ECOG PERFORMANCE STATUS: 0 - Asymptomatic  Blood pressure 148/78, pulse 76, temperature 97.9 F (36.6  C), temperature source Oral, resp. rate 16, weight 167 lb (75.751 kg).  GENERAL:alert, no distress and comfortable SKIN: skin color, texture, turgor are normal, no rashes or significant lesions EYES: PERLA; Conjunctiva are pink and non-injected, sclera clear OROPHARYNX:no exudate, no erythema on lips, buccal mucosa, or tongue. NECK: supple, thyroid normal size, non-tender, without nodularity. No masses CHEST: Normal AP diameter with no gynecomastia. LYMPH:  no palpable lymphadenopathy in the cervical, axillary or inguinal LUNGS: clear to auscultation and percussion with normal breathing effort HEART: regular rate & rhythm and no murmurs. ABDOMEN:abdomen soft, non-tender and normal bowel sounds MUSCULOSKELETAL:no cyanosis of digits and no clubbing. Range of motion normal.  NEURO: alert & oriented x 3 with fluent speech, no focal motor/sensory deficits   LABORATORY DATA: Office Visit on 12/13/2013  Component Date Value Range Status  . WBC 12/13/2013 4.5  4.0 - 10.5 K/uL Final  . RBC 12/13/2013 5.49  4.22 - 5.81 MIL/uL Final  . Hemoglobin 12/13/2013 17.1* 13.0 - 17.0 g/dL Final  . HCT 60/45/4098 47.5  39.0 - 52.0 % Final  . MCV 12/13/2013 86.5  78.0 - 100.0 fL Final  . MCH 12/13/2013 31.1  26.0 - 34.0 pg Final  . MCHC 12/13/2013 36.0  30.0 - 36.0 g/dL Final  . RDW 11/91/4782 12.1  11.5 - 15.5 % Final  . Platelets 12/13/2013 163  150 - 400 K/uL Final  . Neutrophils Relative % 12/13/2013 38* 43 - 77 % Final  . Neutro Abs 12/13/2013 1.7  1.7 - 7.7 K/uL Final  . Lymphocytes Relative 12/13/2013 44  12 - 46 % Final  . Lymphs Abs 12/13/2013 2.0  0.7 - 4.0 K/uL Final  . Monocytes  Relative 12/13/2013 15* 3 - 12 % Final  . Monocytes Absolute 12/13/2013 0.7  0.1 - 1.0 K/uL Final  . Eosinophils Relative 12/13/2013 2  0 - 5 % Final  . Eosinophils Absolute 12/13/2013 0.1  0.0 - 0.7 K/uL Final  . Basophils Relative 12/13/2013 1  0 - 1 % Final  . Basophils Absolute 12/13/2013 0.0  0.0 - 0.1 K/uL  Final    PATHOLOGY: No new pathology.  Urinalysis    Component Value Date/Time   COLORURINE YELLOW 09/18/2011 2344   APPEARANCEUR CLOUDY* 09/18/2011 2344   LABSPEC 1.022 09/18/2011 2344   PHURINE 6.5 09/18/2011 2344   GLUCOSEU NEGATIVE 09/18/2011 2344   HGBUR NEGATIVE 09/18/2011 2344   BILIRUBINUR NEGATIVE 09/18/2011 2344   KETONESUR NEGATIVE 09/18/2011 2344   PROTEINUR NEGATIVE 09/18/2011 2344   UROBILINOGEN 0.2 09/18/2011 2344   NITRITE NEGATIVE 09/18/2011 2344   LEUKOCYTESUR NEGATIVE 09/18/2011 2344    RADIOGRAPHIC STUDIES: No results found.  ASSESSMENT:  #1. 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   PLAN:  #1. Probable phlebotomy on 12/14/2013. #2. Followup in 3 months with CBC and ferritin.   All questions were answered. The patient knows to call the clinic with any problems, questions or concerns. We can certainly see the patient much sooner if necessary.   I spent 25 minutes counseling the patient face to face. The total time spent in the appointment was 30 minutes.    Maurilio LovelyFormanek, Kavish Lafitte A, MD 12/13/2013 11:05 AM

## 2013-12-13 NOTE — Progress Notes (Signed)
Labs drawn today for cbc/diff,ferr,Iron and IBC 

## 2013-12-13 NOTE — Telephone Encounter (Signed)
Message copied by Evelena LeydenBURGESS, Yunuen Mordan S on Thu Dec 13, 2013  3:46 PM ------      Message from: Alla GermanFORMANEK, GREGORY A      Created: Thu Dec 13, 2013  3:38 PM       Please notify Mr. Curley SpiceDarnell that he will not require a phlebotomy but she keep his appointment in 3 months. His ferritin was 13. ------

## 2013-12-13 NOTE — Telephone Encounter (Signed)
Patient notified that phlebotomy not needed at this time and to keep appointments in 3 months as scheduled.  Verbalized understanding of instructions.

## 2013-12-13 NOTE — Patient Instructions (Signed)
[facility] Discharge Instructions  RECOMMENDATIONS MADE BY THE CONSULTANT AND ANY TEST RESULTS WILL BE SENT TO YOUR REFERRING PHYSICIAN.  EXAM FINDINGS BY THE PHYSICIAN TODAY AND SIGNS OR SYMPTOMS TO REPORT TO CLINIC OR PRIMARY PHYSICIAN: Exam and findings as discussed by Dr. Zigmund DanielFormanek.  If you need a phlebotomy we will call you.  MEDICATIONS PRESCRIBED:  none  INSTRUCTIONS/FOLLOW-UP: Follow-up in 3 months with lab work and MD visit.  Thank you for choosing [facility] to provide your oncology and hematology care.  To afford each patient quality time with our providers, please arrive at least 15 minutes before your scheduled appointment time.  With your help, our goal is to use those 15 minutes to complete the necessary work-up to ensure our physicians have the information they need to help with your evaluation and healthcare recommendations.    Effective January 1st, 2014, we ask that you re-schedule your appointment with our physicians should you arrive 10 or more minutes late for your appointment.  We strive to give you quality time with our providers, and arriving late affects you and other patients whose appointments are after yours.    Again, thank you for choosing [facility].  Our hope is that these requests will decrease the amount of time that you wait before being seen by our physicians.       _____________________________________________________________  Should you have questions after your visit to [facility], please contact our office at [phone] between the hours of 8:30 a.m. and 5:00 p.m.  Voicemails left after 4:30 p.m. will not be returned until the following business day.  For prescription refill requests, have your pharmacy contact our office with your prescription refill request.

## 2014-03-12 ENCOUNTER — Encounter (HOSPITAL_COMMUNITY): Payer: 59 | Attending: Hematology and Oncology

## 2014-03-12 ENCOUNTER — Encounter (HOSPITAL_BASED_OUTPATIENT_CLINIC_OR_DEPARTMENT_OTHER): Payer: 59

## 2014-03-12 ENCOUNTER — Encounter (HOSPITAL_COMMUNITY): Payer: Self-pay

## 2014-03-12 LAB — CBC WITH DIFFERENTIAL/PLATELET
Basophils Absolute: 0 10*3/uL (ref 0.0–0.1)
Basophils Relative: 1 % (ref 0–1)
EOS ABS: 0.2 10*3/uL (ref 0.0–0.7)
Eosinophils Relative: 4 % (ref 0–5)
HEMATOCRIT: 49.2 % (ref 39.0–52.0)
HEMOGLOBIN: 18 g/dL — AB (ref 13.0–17.0)
Lymphocytes Relative: 49 % — ABNORMAL HIGH (ref 12–46)
Lymphs Abs: 2 10*3/uL (ref 0.7–4.0)
MCH: 31.6 pg (ref 26.0–34.0)
MCHC: 36.6 g/dL — AB (ref 30.0–36.0)
MCV: 86.3 fL (ref 78.0–100.0)
MONO ABS: 0.4 10*3/uL (ref 0.1–1.0)
Monocytes Relative: 9 % (ref 3–12)
Neutro Abs: 1.5 10*3/uL — ABNORMAL LOW (ref 1.7–7.7)
Neutrophils Relative %: 37 % — ABNORMAL LOW (ref 43–77)
Platelets: 167 10*3/uL (ref 150–400)
RBC: 5.7 MIL/uL (ref 4.22–5.81)
RDW: 12.4 % (ref 11.5–15.5)
WBC: 4 10*3/uL (ref 4.0–10.5)

## 2014-03-12 LAB — FERRITIN: Ferritin: 23 ng/mL (ref 22–322)

## 2014-03-12 NOTE — Patient Instructions (Signed)
[facility] Discharge Instructions  RECOMMENDATIONS MADE BY THE CONSULTANT AND ANY TEST RESULTS WILL BE SENT TO YOUR REFERRING PHYSICIAN.  EXAM FINDINGS BY THE PHYSICIAN TODAY AND SIGNS OR SYMPTOMS TO REPORT TO CLINIC OR PRIMARY PHYSICIAN: Exam and findings as discussed by Dr. Zigmund DanielFormanek.  MEDICATIONS PRESCRIBED:  None  INSTRUCTIONS/FOLLOW-UP: Return in 6 months for CBC and ferritin level.   Thank you for choosing [facility] to provide your oncology and hematology care.  To afford each patient quality time with our providers, please arrive at least 15 minutes before your scheduled appointment time.  With your help, our goal is to use those 15 minutes to complete the necessary work-up to ensure our physicians have the information they need to help with your evaluation and healthcare recommendations.    Effective January 1st, 2014, we ask that you re-schedule your appointment with our physicians should you arrive 10 or more minutes late for your appointment.  We strive to give you quality time with our providers, and arriving late affects you and other patients whose appointments are after yours.    Again, thank you for choosing [facility].  Our hope is that these requests will decrease the amount of time that you wait before being seen by our physicians.       _____________________________________________________________  Should you have questions after your visit to [facility], please contact our office at [phone] between the hours of 8:30 a.m. and 5:00 p.m.  Voicemails left after 4:30 p.m. will not be returned until the following business day.  For prescription refill requests, have your pharmacy contact our office with your prescription refill request.

## 2014-03-12 NOTE — Progress Notes (Signed)
[facility]  OFFICE PROGRESS NOTE  Evette GeorgesDD,JEFFREY ALLEN, MD [address] [place] [id number]  DIAGNOSIS: Hemochromatosis - Plan: CBC with Differential, Ferritin  Chief Complaint  Patient presents with  . Hemochromatosis    CURRENT THERAPY: Periodic phlebotomy.  INTERVAL HISTORY: Chase ForthDustin R Roy 28 y.o. male returns for followup of hemochromatosis undergoing periodic phlebotomy to maintain low ferritin. Last phlebotomy was 5 months ago since most recent ferritin report on 12/13/2013 was only 13. He continues to do well with no headache, pruritus, cough, shortness of breath, but did have problems with allergies for about a month which were well-controlled with oral loratadine. He denies any fever, night sweats, easy satiety, nocturia, palpitations, chest pain, PND, orthopnea, lower extremity swelling or redness, joint pain, skin rash, headache, or seizures. 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  MEDICAL HISTORY: Past Medical History  Diagnosis Date  . Upper respiratory infection of multiple sites   . Hemochromatosis     INTERIM HISTORY: has Routine general medical examination at a health care facility; Iron storage disease; and Hemochromatosis on his problem list.    ALLERGIES:  has No Known Allergies.  MEDICATIONS: has a current medication list which includes the following prescription(s): ibuprofen.  SURGICAL HISTORY:  Past Surgical History  Procedure Laterality Date  . Knee surgery    . Hand surgery    . Tonsillectomy  1990    FAMILY HISTORY: family history includes Anemia in his sister; Hemochromatosis in his sister; Hypertension in his father and mother.  SOCIAL HISTORY:  reports that he quit smoking about 7 years ago. His smoking use included Cigarettes. He smoked 0.00 packs per day. His smokeless tobacco use includes Snuff. He reports that he drinks alcohol. He reports that he does not use illicit  drugs.  REVIEW OF SYSTEMS:  Other than that discussed above is noncontributory.  PHYSICAL EXAMINATION: ECOG PERFORMANCE STATUS: 0 - Asymptomatic  Blood pressure 132/91, pulse 70, temperature 98.2 F (36.8 C), temperature source Oral, resp. rate 16, weight 163 lb (73.936 kg).  GENERAL:alert, no distress and comfortable SKIN: skin color, texture, turgor are normal, no rashes or significant lesions EYES: PERLA; Conjunctiva are pink and non-injected, sclera clear SINUSES: No redness or tenderness over maxillary or ethmoid sinuses OROPHARYNX:no exudate, no erythema on lips, buccal mucosa, or tongue. NECK: supple, thyroid normal size, non-tender, without nodularity. No masses CHEST: Normal AP diameter with no gynecomastia. LYMPH:  no palpable lymphadenopathy in the cervical, axillary or inguinal LUNGS: clear to auscultation and percussion with normal breathing effort HEART: regular rate & rhythm and no murmurs. ABDOMEN:abdomen soft, non-tender and normal bowel sounds MUSCULOSKELETAL:no cyanosis of digits and no clubbing. Range of motion normal.  NEURO: alert & oriented x 3 with fluent speech, no focal motor/sensory deficits   LABORATORY DATA: Appointment on 03/12/2014  Component Date Value Ref Range Status  . WBC 03/12/2014 4.0  4.0 - 10.5 K/uL Final  . RBC 03/12/2014 5.70  4.22 - 5.81 MIL/uL Final  . Hemoglobin 03/12/2014 18.0* 13.0 - 17.0 g/dL Final  . HCT 60/45/409805/03/2014 49.2  39.0 - 52.0 % Final  . MCV 03/12/2014 86.3  78.0 - 100.0 fL Final  . MCH 03/12/2014 31.6  26.0 - 34.0 pg Final  . MCHC 03/12/2014 36.6* 30.0 - 36.0 g/dL Final   CORRECTED FOR COLD AGGLUTININS  . RDW 03/12/2014 12.4  11.5 - 15.5 % Final  . Platelets 03/12/2014 167  150 - 400  K/uL Final  . Neutrophils Relative % 03/12/2014 37* 43 - 77 % Final  . Neutro Abs 03/12/2014 1.5* 1.7 - 7.7 K/uL Final  . Lymphocytes Relative 03/12/2014 49* 12 - 46 % Final  . Lymphs Abs 03/12/2014 2.0  0.7 - 4.0 K/uL Final  . Monocytes  Relative 03/12/2014 9  3 - 12 % Final  . Monocytes Absolute 03/12/2014 0.4  0.1 - 1.0 K/uL Final  . Eosinophils Relative 03/12/2014 4  0 - 5 % Final  . Eosinophils Absolute 03/12/2014 0.2  0.0 - 0.7 K/uL Final  . Basophils Relative 03/12/2014 1  0 - 1 % Final  . Basophils Absolute 03/12/2014 0.0  0.0 - 0.1 K/uL Final    PATHOLOGY: No new pathology.  Urinalysis    Component Value Date/Time   COLORURINE YELLOW 09/18/2011 2344   APPEARANCEUR CLOUDY* 09/18/2011 2344   LABSPEC 1.022 09/18/2011 2344   PHURINE 6.5 09/18/2011 2344   GLUCOSEU NEGATIVE 09/18/2011 2344   HGBUR NEGATIVE 09/18/2011 2344   BILIRUBINUR NEGATIVE 09/18/2011 2344   KETONESUR NEGATIVE 09/18/2011 2344   PROTEINUR NEGATIVE 09/18/2011 2344   UROBILINOGEN 0.2 09/18/2011 2344   NITRITE NEGATIVE 09/18/2011 2344   LEUKOCYTESUR NEGATIVE 09/18/2011 2344    RADIOGRAPHIC STUDIES: No results found.  ASSESSMENT:  #1. 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     PLAN:  #1. Phlebotomize if his ferritin is greater than 100. #2. Followup in 6 months with CBC and ferritin. Continue low iron diet avoiding read by often or fruits such as raisins or apricots.   All questions were answered. The patient knows to call the clinic with any problems, questions or concerns. We can certainly see the patient much sooner if necessary.   I spent 25 minutes counseling the patient face to face. The total time spent in the appointment was 30 minutes.    Alla GermanGregory Janeil Schexnayder, MD 03/12/2014 10:50 AM  DISCLAIMER:  This note was dictated with voice recognition software.  Similar sounding words can inadvertently be transcribed inaccurately and may not be corrected upon review.

## 2014-04-05 NOTE — Progress Notes (Signed)
Labs drawn

## 2014-09-12 ENCOUNTER — Encounter (HOSPITAL_BASED_OUTPATIENT_CLINIC_OR_DEPARTMENT_OTHER): Payer: 59

## 2014-09-12 ENCOUNTER — Encounter (HOSPITAL_COMMUNITY): Payer: 59 | Attending: Hematology and Oncology

## 2014-09-12 ENCOUNTER — Encounter (HOSPITAL_COMMUNITY): Payer: Self-pay

## 2014-09-12 LAB — FERRITIN: FERRITIN: 50 ng/mL (ref 22–322)

## 2014-09-12 LAB — CBC
HEMATOCRIT: 48 % (ref 39.0–52.0)
HEMOGLOBIN: 17.2 g/dL — AB (ref 13.0–17.0)
MCH: 31.6 pg (ref 26.0–34.0)
MCHC: 35.8 g/dL (ref 30.0–36.0)
MCV: 88.1 fL (ref 78.0–100.0)
Platelets: 193 10*3/uL (ref 150–400)
RBC: 5.45 MIL/uL (ref 4.22–5.81)
RDW: 12 % (ref 11.5–15.5)
WBC: 4.5 10*3/uL (ref 4.0–10.5)

## 2014-09-12 LAB — IRON AND TIBC
Iron: 217 ug/dL — ABNORMAL HIGH (ref 42–135)
Saturation Ratios: 73 % — ABNORMAL HIGH (ref 20–55)
TIBC: 296 ug/dL (ref 215–435)
UIBC: 79 ug/dL — AB (ref 125–400)

## 2014-09-12 NOTE — Patient Instructions (Signed)
[facility] Discharge Instructions  RECOMMENDATIONS MADE BY THE CONSULTANT AND ANY TEST RESULTS WILL BE SENT TO YOUR REFERRING PHYSICIAN.  EXAM FINDINGS BY THE PHYSICIAN TODAY AND SIGNS OR SYMPTOMS TO REPORT TO CLINIC OR PRIMARY PHYSICIAN: You saw Dr Zigmund DanielFormanek today  MEDICATIONS PRESCRIBED:  No new medications  INSTRUCTIONS GIVEN AND DISCUSSED: We will call you if there is anything concerning with your lab work.  SPECIAL INSTRUCTIONS/FOLLOW-UP: Follow up in 6 months with doctor and lab work.  Thank you for choosing [facility] to provide your oncology and hematology care.  To afford each patient quality time with our providers, please arrive at least 15 minutes before your scheduled appointment time.  With your help, our goal is to use those 15 minutes to complete the necessary work-up to ensure our physicians have the information they need to help with your evaluation and healthcare recommendations.    Effective January 1st, 2014, we ask that you re-schedule your appointment with our physicians should you arrive 10 or more minutes late for your appointment.  We strive to give you quality time with our providers, and arriving late affects you and other patients whose appointments are after yours.    Again, thank you for choosing [facility].  Our hope is that these requests will decrease the amount of time that you wait before being seen by our physicians.       _____________________________________________________________  Should you have questions after your visit to [facility], please contact our office at [phone] between the hours of 8:30 a.m. and 5:00 p.m.  Voicemails left after 4:30 p.m. will not be returned until the following business day.  For prescription refill requests, have your pharmacy contact our office with your prescription refill request.

## 2014-09-12 NOTE — Progress Notes (Signed)
[facility]  OFFICE PROGRESS NOTE  Evette GeorgesDD,JEFFREY ALLEN, MD [address] [place] [id number]  DIAGNOSIS: Hemochromatosis - Plan: CANCELED: CBC with Differential, CANCELED: Ferritin  Chief Complaint  Patient presents with  . Hemochromatosis    CURRENT THERAPY: periodic phlebotomy.  INTERVAL HISTORY: Chase Roy 28 y.o. male returns for  followup of hemochromatosis undergoing periodic phlebotomy to maintain low ferritin. Last phlebotomy was 5 months ago since most recent ferritin report on 03/12/2014 was only 23. About 2 weeks ago he did develop a fever but took only antipyretics. He denies any significant nasal symptoms, sore throat, earache, cough, shortness of breath, PND, orthopnea, palpitations, dysuria, hematuria, polyuria, nocturia, lower extremity swelling or redness, skin rash, headache, or seizures.   MEDICAL HISTORY: Past Medical History  Diagnosis Date  . Upper respiratory infection of multiple sites   . Hemochromatosis     INTERIM HISTORY: has Routine general medical examination at a health care facility; Iron storage disease; and Hemochromatosis on his problem list.    ALLERGIES:  has No Known Allergies.  MEDICATIONS: has a current medication list which includes the following prescription(s): ibuprofen.  SURGICAL HISTORY:  Past Surgical History  Procedure Laterality Date  . Knee surgery    . Hand surgery    . Tonsillectomy  1990    FAMILY HISTORY: family history includes Anemia in his sister; Hemochromatosis in his sister; Hypertension in his father and mother.  SOCIAL HISTORY:  reports that he quit smoking about 7 years ago. His smoking use included Cigarettes. He smoked 0.00 packs per day. His smokeless tobacco use includes Snuff. He reports that he drinks alcohol. He reports that he does not use illicit drugs.  REVIEW OF SYSTEMS:  Other than that discussed above is noncontributory.  PHYSICAL  EXAMINATION: ECOG PERFORMANCE STATUS: 0 - Asymptomatic  Blood pressure 132/92, pulse 73, temperature 98.1 F (36.7 C), temperature source Oral, resp. rate 18, weight 150 lb 8 oz (68.266 kg), SpO2 99 %.  GENERAL:alert, no distress and comfortable SKIN: skin color, texture, turgor are normal, no rashes or significant lesions EYES: PERLA; Conjunctiva are pink and non-injected, sclera clear SINUSES: No redness or tenderness over maxillary or ethmoid sinuses OROPHARYNX:no exudate, no erythema on lips, buccal mucosa, or tongue. NECK: supple, thyroid normal size, non-tender, without nodularity. No masses CHEST: normal AP diameter with no breast masses. LYMPH:  no palpable lymphadenopathy in the cervical, axillary or inguinal LUNGS: clear to auscultation and percussion with normal breathing effort HEART: regular rate & rhythm and no murmurs. ABDOMEN:abdomen soft, non-tender and normal bowel sounds MUSCULOSKELETAL:no cyanosis of digits and no clubbing. Range of motion normal.  NEURO: alert & oriented x 3 with fluent speech, no focal motor/sensory deficits   LABORATORY DATA: Lab on 09/12/2014  Component Date Value Ref Range Status  . WBC 09/12/2014 4.5  4.0 - 10.5 K/uL Final  . RBC 09/12/2014 5.45  4.22 - 5.81 MIL/uL Final  . Hemoglobin 09/12/2014 17.2* 13.0 - 17.0 g/dL Final  . HCT 19/14/782911/03/2014 48.0  39.0 - 52.0 % Final  . MCV 09/12/2014 88.1  78.0 - 100.0 fL Final  . MCH 09/12/2014 31.6  26.0 - 34.0 pg Final  . MCHC 09/12/2014 35.8  30.0 - 36.0 g/dL Final  . RDW 56/21/308611/03/2014 12.0  11.5 - 15.5 % Final  . Platelets 09/12/2014 193  150 - 400 K/uL Final    PATHOLOGY:no new pathology.  Urinalysis    Component Value  Date/Time   COLORURINE YELLOW 09/18/2011 2344   APPEARANCEUR CLOUDY* 09/18/2011 2344   LABSPEC 1.022 09/18/2011 2344   PHURINE 6.5 09/18/2011 2344   GLUCOSEU NEGATIVE 09/18/2011 2344   HGBUR NEGATIVE 09/18/2011 2344   BILIRUBINUR NEGATIVE 09/18/2011 2344   KETONESUR NEGATIVE  09/18/2011 2344   PROTEINUR NEGATIVE 09/18/2011 2344   UROBILINOGEN 0.2 09/18/2011 2344   NITRITE NEGATIVE 09/18/2011 2344   LEUKOCYTESUR NEGATIVE 09/18/2011 2344    RADIOGRAPHIC STUDIES: No results found.  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 Phlebotomize if his ferritin is greater than 100. #2. Followup in 6 months with CBC and ferritin. Continue low iron diet avoiding read by often or fruits such as raisins or apricots   All questions were answered. The patient knows to call the clinic with any problems, questions or concerns. We can certainly see the patient much sooner if necessary.   I spent 25 minutes counseling the patient face to face. The total time spent in the appointment was 30 minutes.    Maurilio LovelyFormanek, Kelie Gainey A, MD 09/12/2014 10:13 AM  DISCLAIMER:  This note was dictated with voice recognition software.  Similar sounding words can inadvertently be transcribed inaccurately and may not be corrected upon review.

## 2014-09-12 NOTE — Progress Notes (Signed)
LABS FOR FERR,IRON/TIBC,CBCD 

## 2014-09-13 NOTE — Progress Notes (Signed)
Message left on patient's cell voicemail letting him know his labs were good and to return in May 2016. I instructed him to call if he began to feel sluggish or tired.

## 2015-03-06 ENCOUNTER — Other Ambulatory Visit (HOSPITAL_COMMUNITY): Payer: Self-pay

## 2015-03-13 ENCOUNTER — Other Ambulatory Visit (HOSPITAL_COMMUNITY): Payer: 59

## 2015-03-13 ENCOUNTER — Encounter (HOSPITAL_COMMUNITY): Payer: Self-pay | Admitting: Hematology & Oncology

## 2015-03-13 ENCOUNTER — Encounter (HOSPITAL_BASED_OUTPATIENT_CLINIC_OR_DEPARTMENT_OTHER): Payer: 59

## 2015-03-13 ENCOUNTER — Encounter (HOSPITAL_COMMUNITY): Payer: 59 | Attending: Hematology & Oncology | Admitting: Hematology & Oncology

## 2015-03-13 DIAGNOSIS — Z800 Family history of malignant neoplasm of digestive organs: Secondary | ICD-10-CM

## 2015-03-13 DIAGNOSIS — Z832 Family history of diseases of the blood and blood-forming organs and certain disorders involving the immune mechanism: Secondary | ICD-10-CM | POA: Diagnosis not present

## 2015-03-13 LAB — CBC WITH DIFFERENTIAL/PLATELET
BASOS ABS: 0 10*3/uL (ref 0.0–0.1)
Basophils Relative: 1 % (ref 0–1)
EOS ABS: 0.1 10*3/uL (ref 0.0–0.7)
EOS PCT: 3 % (ref 0–5)
HEMATOCRIT: 48.7 % (ref 39.0–52.0)
Hemoglobin: 17.5 g/dL — ABNORMAL HIGH (ref 13.0–17.0)
Lymphocytes Relative: 36 % (ref 12–46)
Lymphs Abs: 1.5 10*3/uL (ref 0.7–4.0)
MCH: 32.2 pg (ref 26.0–34.0)
MCHC: 35.9 g/dL (ref 30.0–36.0)
MCV: 89.5 fL (ref 78.0–100.0)
Monocytes Absolute: 0.3 10*3/uL (ref 0.1–1.0)
Monocytes Relative: 8 % (ref 3–12)
Neutro Abs: 2.1 10*3/uL (ref 1.7–7.7)
Neutrophils Relative %: 52 % (ref 43–77)
PLATELETS: 177 10*3/uL (ref 150–400)
RBC: 5.44 MIL/uL (ref 4.22–5.81)
RDW: 12.1 % (ref 11.5–15.5)
WBC: 4.1 10*3/uL (ref 4.0–10.5)

## 2015-03-13 LAB — FERRITIN: Ferritin: 84 ng/mL (ref 24–336)

## 2015-03-13 NOTE — Progress Notes (Addendum)
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 male returns for hemochromatosis. He is doing well. He has a significant family history of hemochromatosis. Maternal grandfather died from pancreatic cancer, family is not sure if he had hemochromatosis. Aunt, mom and sister have hemochromatosis. They do not have phlebotomies; Aunt does now after menopause. Denies any changes. Denies smoking. [place] as a [place]   MEDICAL HISTORY: Past Medical History  Diagnosis Date  . Upper respiratory infection of multiple sites   . Hemochromatosis     has Routine general medical examination at a health care facility; Iron storage disease; and Hemochromatosis on his problem list.     has No Known Allergies.  @MEDADMINPROSE @  SURGICAL HISTORY: Past Surgical History  Procedure Laterality Date  . Knee surgery    . Hand surgery    . Tonsillectomy  1990    SOCIAL HISTORY: History   Social History  . Marital Status: Single    Spouse Name: N/A  . Number of Children: N/A  . Years of Education: N/A   Occupational History  . Not on file.   Social History Main Topics  . Smoking status: Former Smoker    Types: Cigarettes    Quit date: 11/02/2006  . Smokeless tobacco: Current User    Types: Snuff  . Alcohol Use: Yes  . Drug Use: No  . Sexual Activity: Yes   Other Topics Concern  . Not on file   Social History Narrative    FAMILY HISTORY: Family History  Problem Relation Age of Onset  . Hypertension Mother   . Hypertension Father   . Hemochromatosis Sister   . Anemia Sister     Review of Systems  Constitutional: Negative for fever, chills, weight loss and malaise/fatigue.  HENT: Negative for congestion, hearing loss, nosebleeds, sore throat and tinnitus.   Eyes: Negative for blurred vision,  double vision, pain and discharge.  Respiratory: Negative for cough, hemoptysis, sputum production, shortness of breath and wheezing.   Cardiovascular: Negative for chest pain, palpitations, claudication, leg swelling and PND.  Gastrointestinal: Negative for heartburn, nausea, vomiting, abdominal pain, diarrhea, constipation, blood in stool and melena.  Genitourinary: Negative for dysuria, urgency, frequency and hematuria.  Musculoskeletal: Negative for myalgias, joint pain and falls.  Skin: Negative for itching and rash.  Neurological: Negative for dizziness, tingling, tremors, sensory change, speech change, focal weakness, seizures, loss of consciousness, weakness and headaches.  Endo/Heme/Allergies: Does not bruise/bleed easily.  Psychiatric/Behavioral: Negative for depression, suicidal ideas, memory loss and substance abuse. The patient is not nervous/anxious and does not have insomnia.     PHYSICAL EXAMINATION  ECOG PERFORMANCE STATUS: 0 - Asymptomatic  Filed Vitals:   03/13/15 0834  BP: 134/90  Pulse: 72  Temp: 97.7 F (36.5 C)  Resp: 18    Physical Exam  Constitutional: He is oriented to person, place, and time and well-developed, well-nourished, and in no distress.  HENT:  Head: Normocephalic and atraumatic.  Eyes: Pupils are equal, round, and reactive to light.  Neck: Normal range of motion. Neck supple.  Cardiovascular: Normal rate and regular rhythm.   Pulmonary/Chest: Effort normal and breath sounds normal.  Abdominal: Soft. Bowel sounds are normal.  Musculoskeletal: Normal range of motion.  Neurological: He is alert and oriented to person, place, and time.  Skin: Skin is warm and dry.  Psychiatric:  Mood, memory, affect and judgment normal.    LABORATORY DATA:  CBC    Component Value Date/Time   WBC 4.1 03/13/2015 0848   RBC 5.44 03/13/2015 0848   HGB 17.5* 03/13/2015 0848   HCT 48.7 03/13/2015 0848   PLT 177 03/13/2015 0848   MCV 89.5 03/13/2015 0848    MCH 32.2 03/13/2015 0848   MCHC 35.9 03/13/2015 0848   RDW 12.1 03/13/2015 0848   LYMPHSABS 1.5 03/13/2015 0848   MONOABS 0.3 03/13/2015 0848   EOSABS 0.1 03/13/2015 0848   BASOSABS 0.0 03/13/2015 0848   CMP     Component Value Date/Time   NA 139 10/29/2013 0838   K 4.1 10/29/2013 0838   CL 102 10/29/2013 0838   CO2 28 10/29/2013 0838   GLUCOSE 72 10/29/2013 0838   BUN 10 10/29/2013 0838   CREATININE 1.08 10/29/2013 0838   CALCIUM 9.3 10/29/2013 0838   PROT 7.5 10/29/2013 0838   ALBUMIN 4.2 10/29/2013 0838   AST 25 10/29/2013 0838   ALT 33 10/29/2013 0838   ALKPHOS 92 10/29/2013 0838   BILITOT 0.7 10/29/2013 0838   GFRNONAA >90 10/29/2013 0838   GFRAA >90 10/29/2013 0838     ASSESSMENT and THERAPY PLAN:   Hemochromatosis, homozygous C282Y mutation  [age] old male with hemochromatosis. He receives intermittent phlebotomy. He is a significant family history of the disease. He currently is doing well. He has no major complaints concerns or questions. We will continue with ongoing observation and following his laboratory studies including ferritin on a regular basis. He will be phlebotomized as needed.  All questions were answered. The patient knows to call the clinic with any problems, questions or concerns. We can certainly see the patient much sooner if necessary. This note was electronically signed.  This document serves as a record of services personally performed by Loma MessingShannon Penland, MD. It was created on her behalf by Delana MeyerElizabeth Ashley, a trained medical scribe. The creation of this record is based on the scribe's personal observations and the provider's statements to them. This document has been checked and approved by the attending provider. I have reviewed the above documentation for accuracy and completeness and I agree with the above.   Loma MessingShannon Penland, MD

## 2015-03-13 NOTE — Progress Notes (Signed)
Labs drawn

## 2015-03-13 NOTE — Patient Instructions (Signed)
..   [facility] Discharge Instructions  RECOMMENDATIONS MADE BY THE CONSULTANT AND ANY TEST RESULTS WILL BE SENT TO YOUR REFERRING PHYSICIAN.  Exam today per Dr. Lucilla EdinPenland Labs in November and see the Dr at that time   [facility] to provide your oncology and hematology care.  To afford each patient quality time with our provider, please arrive at least 15 minutes before your scheduled appointment time.    You need to re-schedule your appointment should you arrive 10 or more minutes late.  We strive to give you quality time with our providers, and arriving late affects you and other patients whose appointments are after yours.  Also, if you no show three or more times for appointments you may be dismissed from the clinic at the providers discretion.     Again, thank you for choosing [facility].  Our hope is that these requests will decrease the amount of time that you wait before being seen by our physicians.       _____________________________________________________________  Should you have questions after your visit to [facility], please contact our office at [phone] between the hours of 8:30 a.m. and 4:30 p.m.  Voicemails left after 4:30 p.m. will not be returned until the following business day.  For prescription refill requests, have your pharmacy contact our office.

## 2015-03-24 ENCOUNTER — Other Ambulatory Visit (HOSPITAL_COMMUNITY): Payer: Self-pay

## 2015-09-11 ENCOUNTER — Other Ambulatory Visit (HOSPITAL_COMMUNITY): Payer: 59

## 2015-09-11 ENCOUNTER — Ambulatory Visit (HOSPITAL_COMMUNITY): Payer: 59 | Admitting: Hematology & Oncology

## 2015-09-11 ENCOUNTER — Ambulatory Visit (HOSPITAL_COMMUNITY): Payer: 59 | Admitting: Oncology

## 2015-09-15 ENCOUNTER — Other Ambulatory Visit (HOSPITAL_COMMUNITY): Payer: 59

## 2015-09-15 ENCOUNTER — Ambulatory Visit (HOSPITAL_COMMUNITY): Payer: 59 | Admitting: Hematology & Oncology

## 2015-10-07 ENCOUNTER — Ambulatory Visit (HOSPITAL_COMMUNITY): Payer: 59 | Admitting: Hematology & Oncology

## 2015-10-07 ENCOUNTER — Other Ambulatory Visit (HOSPITAL_COMMUNITY): Payer: 59

## 2015-10-20 NOTE — Progress Notes (Signed)
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 male returns for hemochromatosis. He is doing well. He has a significant family history of hemochromatosis. Maternal grandfather died from pancreatic cancer, family is not sure if he had hemochromatosis. Aunt, mom and sister have hemochromatosis. They do not have phlebotomies; Aunt does now after menopause. Denies any changes. Denies smoking. 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  He is doing well without new complaints.  He was thinking about starting to donate blood and wondered if this was an option. He denies new fatigue, SOB or fever.    MEDICAL HISTORY: Past Medical History  Diagnosis Date  . Upper respiratory infection of multiple sites   . Hemochromatosis     has Routine general medical examination at a health care facility; Iron storage disease; and Hemochromatosis on his problem list.     has No Known Allergies.   SURGICAL HISTORY: Past Surgical History  Procedure Laterality Date  . Knee surgery    . Hand surgery    . Tonsillectomy  1990    SOCIAL HISTORY: Social History   Social History  . Marital Status: Single    Spouse Name: N/A  . Number of Children: N/A  . Years of Education: N/A   Occupational History  . Not on file.   Social History Main Topics  . Smoking status: Former Smoker    Types: Cigarettes    Quit date: 11/02/2006  . Smokeless tobacco: Current User    Types: Snuff  . Alcohol Use: Yes  . Drug Use: No  . Sexual Activity: Yes   Other Topics Concern  . Not on file   Social History Narrative    FAMILY HISTORY: Family History  Problem Relation Age of Onset  . Hypertension Mother   . Hypertension Father   . Hemochromatosis Sister   . Anemia Sister     Review of Systems  Constitutional: Negative for fever, chills,  weight loss and malaise/fatigue.  HENT: Negative for congestion, hearing loss, nosebleeds, sore throat and tinnitus.   Eyes: Negative for blurred vision, double vision, pain and discharge.  Respiratory: Negative for cough, hemoptysis, sputum production, shortness of breath and wheezing.   Cardiovascular: Negative for chest pain, palpitations, claudication, leg swelling and PND.  Gastrointestinal: Negative for heartburn, nausea, vomiting, abdominal pain, diarrhea, constipation, blood in stool and melena.  Genitourinary: Negative for dysuria, urgency, frequency and hematuria.  Musculoskeletal: Negative for myalgias, joint pain and falls.  Skin: Negative for itching and rash.  Neurological: Negative for dizziness, tingling, tremors, sensory change, speech change, focal weakness, seizures, loss of consciousness, weakness and headaches.  Endo/Heme/Allergies: Does not bruise/bleed easily.  Psychiatric/Behavioral: Negative for depression, suicidal ideas, memory loss and substance abuse. The patient is not nervous/anxious and does not have insomnia.     PHYSICAL EXAMINATION  ECOG PERFORMANCE STATUS: 0 - Asymptomatic  Filed Vitals:   10/21/15 0926  BP: 133/88  Pulse: 61  Temp: 97.6 F (36.4 C)  Resp: 16    Physical Exam  Constitutional: He is oriented to person, place, and time and well-developed, well-nourished, and in no distress.  HENT:  Head: Normocephalic and atraumatic.  Nose: Nose normal.  Mouth/Throat: Oropharynx is clear and moist. No oropharyngeal exudate.  Eyes: Conjunctivae and EOM are normal. Pupils are equal, round, and reactive to light. Right eye exhibits no discharge. Left eye  exhibits no discharge. No scleral icterus.  Neck: Normal range of motion. Neck supple. No tracheal deviation present. No thyromegaly present.  Cardiovascular: Normal rate, regular rhythm and normal heart sounds.  Exam reveals no gallop and no friction rub.   No murmur heard. Pulmonary/Chest: Effort  normal and breath sounds normal. He has no wheezes. He has no rales.  Abdominal: Soft. Bowel sounds are normal. He exhibits no distension and no mass. There is no tenderness. There is no rebound and no guarding.  Musculoskeletal: Normal range of motion. He exhibits no edema.  Lymphadenopathy:    He has no cervical adenopathy.  Neurological: He is alert and oriented to person, place, and time. He has normal reflexes. No cranial nerve deficit. Gait normal. Coordination normal.  Skin: Skin is warm and dry. No rash noted.  Psychiatric: Mood, memory, affect and judgment normal.  Nursing note and vitals reviewed.   LABORATORY DATA: I have reviewed the data as listed. CBC    Component Value Date/Time   WBC 5.1 10/21/2015 0852   RBC 5.41 10/21/2015 0852   HGB 17.6* 10/21/2015 0852   HCT 49.1 10/21/2015 0852   PLT 166 10/21/2015 0852   MCV 90.8 10/21/2015 0852   MCH 32.5 10/21/2015 0852   MCHC 35.8 10/21/2015 0852   RDW 12.1 10/21/2015 0852   LYMPHSABS 1.4 10/21/2015 0852   MONOABS 0.5 10/21/2015 0852   EOSABS 0.3 10/21/2015 0852   BASOSABS 0.0 10/21/2015 0852   CMP     Component Value Date/Time   NA 141 10/21/2015 0852   K 4.4 10/21/2015 0852   CL 102 10/21/2015 0852   CO2 33* 10/21/2015 0852   GLUCOSE 86 10/21/2015 0852   BUN 10 10/21/2015 0852   CREATININE 1.03 10/21/2015 0852   CALCIUM 9.9 10/21/2015 0852   PROT 7.7 10/21/2015 0852   ALBUMIN 4.7 10/21/2015 0852   AST 26 10/21/2015 0852   ALT 30 10/21/2015 0852   ALKPHOS 86 10/21/2015 0852   BILITOT 0.8 10/21/2015 0852   GFRNONAA >60 10/21/2015 0852   GFRAA >60 10/21/2015 0852     ASSESSMENT and THERAPY PLAN:   Hemochromatosis, homozygous C282Y mutation  [age] year old male with hemochromatosis. He receives intermittent phlebotomy. He is a significant family history of the disease. He currently is doing well. He has no major complaints concerns or questions. We will continue with ongoing observation and following his  laboratory studies including ferritin on a regular basis.  I advised him that donating blood is a good option for him. I discussed this with him in detail. We will see what his ferritin is today and I advised him he could consider donation 2 to 3 times yearly.  All questions were answered. The patient knows to call the clinic with any problems, questions or concerns. We can certainly see the patient much sooner if necessary.  This note was electronically signed.   Loma MessingShannon Penland, MD

## 2015-10-21 ENCOUNTER — Encounter (HOSPITAL_COMMUNITY): Payer: Self-pay | Admitting: Hematology & Oncology

## 2015-10-21 ENCOUNTER — Encounter (HOSPITAL_BASED_OUTPATIENT_CLINIC_OR_DEPARTMENT_OTHER): Payer: 59

## 2015-10-21 ENCOUNTER — Encounter (HOSPITAL_COMMUNITY): Payer: 59 | Attending: Hematology & Oncology | Admitting: Hematology & Oncology

## 2015-10-21 DIAGNOSIS — Z808 Family history of malignant neoplasm of other organs or systems: Secondary | ICD-10-CM | POA: Diagnosis not present

## 2015-10-21 DIAGNOSIS — Z832 Family history of diseases of the blood and blood-forming organs and certain disorders involving the immune mechanism: Secondary | ICD-10-CM | POA: Diagnosis not present

## 2015-10-21 LAB — CBC WITH DIFFERENTIAL/PLATELET
Basophils Absolute: 0 10*3/uL (ref 0.0–0.1)
Basophils Relative: 1 %
Eosinophils Absolute: 0.3 10*3/uL (ref 0.0–0.7)
Eosinophils Relative: 5 %
HCT: 49.1 % (ref 39.0–52.0)
Hemoglobin: 17.6 g/dL — ABNORMAL HIGH (ref 13.0–17.0)
LYMPHS ABS: 1.4 10*3/uL (ref 0.7–4.0)
LYMPHS PCT: 27 %
MCH: 32.5 pg (ref 26.0–34.0)
MCHC: 35.8 g/dL (ref 30.0–36.0)
MCV: 90.8 fL (ref 78.0–100.0)
MONO ABS: 0.5 10*3/uL (ref 0.1–1.0)
Monocytes Relative: 10 %
Neutro Abs: 2.9 10*3/uL (ref 1.7–7.7)
Neutrophils Relative %: 57 %
PLATELETS: 166 10*3/uL (ref 150–400)
RBC: 5.41 MIL/uL (ref 4.22–5.81)
RDW: 12.1 % (ref 11.5–15.5)
WBC: 5.1 10*3/uL (ref 4.0–10.5)

## 2015-10-21 LAB — COMPREHENSIVE METABOLIC PANEL
ALT: 30 U/L (ref 17–63)
AST: 26 U/L (ref 15–41)
Albumin: 4.7 g/dL (ref 3.5–5.0)
Alkaline Phosphatase: 86 U/L (ref 38–126)
Anion gap: 6 (ref 5–15)
BILIRUBIN TOTAL: 0.8 mg/dL (ref 0.3–1.2)
BUN: 10 mg/dL (ref 6–20)
CALCIUM: 9.9 mg/dL (ref 8.9–10.3)
CHLORIDE: 102 mmol/L (ref 101–111)
CO2: 33 mmol/L — ABNORMAL HIGH (ref 22–32)
CREATININE: 1.03 mg/dL (ref 0.61–1.24)
GFR calc non Af Amer: >60 mL/min (ref >60)
Glucose, Bld: 86 mg/dL (ref 65–99)
Potassium: 4.4 mmol/L (ref 3.5–5.1)
Sodium: 141 mmol/L (ref 135–145)
TOTAL PROTEIN: 7.7 g/dL (ref 6.5–8.1)

## 2015-10-21 LAB — FERRITIN: Ferritin: 52 ng/mL (ref 24–336)

## 2015-10-21 NOTE — Patient Instructions (Addendum)
[facility] at [facility] Discharge Instructions  RECOMMENDATIONS MADE BY THE CONSULTANT AND ANY TEST RESULTS WILL BE SENT TO YOUR REFERRING PHYSICIAN.   Exam completed by Dr Galen ManilaPenland Today You can give blood if you need a phlebotomy. We will call you with your blood work results Return to see the doctor in 6 months with lab work Please call the clinic if you have any questions or concerns    Thank you for choosing  [facility] Center at [facility] to provide your oncology and hematology care.  To afford each patient quality time with our provider, please arrive at least 15 minutes before your scheduled appointment time.    You need to re-schedule your appointment should you arrive 10 or more minutes late.  We strive to give you quality time with our providers, and arriving late affects you and other patients whose appointments are after yours.  Also, if you no show three or more times for appointments you may be dismissed from the clinic at the providers discretion.     Again, thank you for choosing [facility].  Our hope is that these requests will decrease the amount of time that you wait before being seen by our physicians.       _____________________________________________________________  Should you have questions after your visit to [facility], please contact our office at [phone] between the hours of 8:30 a.m. and 4:30 p.m.  Voicemails left after 4:30 p.m. will not be returned until the following business day.  For prescription refill requests, have your pharmacy contact our office.

## 2015-10-21 NOTE — Progress Notes (Signed)
LABS DRAWN

## 2016-04-20 ENCOUNTER — Ambulatory Visit (HOSPITAL_COMMUNITY): Payer: 59 | Admitting: Oncology

## 2016-04-20 ENCOUNTER — Other Ambulatory Visit (HOSPITAL_COMMUNITY): Payer: 59

## 2016-05-26 ENCOUNTER — Other Ambulatory Visit (HOSPITAL_COMMUNITY): Payer: 59

## 2016-05-26 ENCOUNTER — Ambulatory Visit (HOSPITAL_COMMUNITY): Payer: 59 | Admitting: Oncology

## 2016-06-23 ENCOUNTER — Encounter (HOSPITAL_COMMUNITY): Payer: 59 | Attending: Oncology | Admitting: Oncology

## 2016-06-23 ENCOUNTER — Encounter (HOSPITAL_COMMUNITY): Payer: Self-pay | Admitting: Oncology

## 2016-06-23 ENCOUNTER — Encounter (HOSPITAL_COMMUNITY): Payer: 59

## 2016-06-23 LAB — COMPREHENSIVE METABOLIC PANEL
ALBUMIN: 4.3 g/dL (ref 3.5–5.0)
ALK PHOS: 68 U/L (ref 38–126)
ALT: 31 U/L (ref 17–63)
AST: 24 U/L (ref 15–41)
Anion gap: 7 (ref 5–15)
BUN: 16 mg/dL (ref 6–20)
CALCIUM: 9.1 mg/dL (ref 8.9–10.3)
CO2: 28 mmol/L (ref 22–32)
Chloride: 102 mmol/L (ref 101–111)
Creatinine, Ser: 1.05 mg/dL (ref 0.61–1.24)
GFR calc Af Amer: >60 mL/min (ref >60)
GFR calc non Af Amer: >60 mL/min (ref >60)
GLUCOSE: 86 mg/dL (ref 65–99)
Potassium: 3.5 mmol/L (ref 3.5–5.1)
SODIUM: 137 mmol/L (ref 135–145)
Total Bilirubin: 0.9 mg/dL (ref 0.3–1.2)
Total Protein: 7.2 g/dL (ref 6.5–8.1)

## 2016-06-23 LAB — CBC WITH DIFFERENTIAL/PLATELET
Basophils Absolute: 0 10*3/uL (ref 0.0–0.1)
Basophils Relative: 0 %
Eosinophils Absolute: 0.1 10*3/uL (ref 0.0–0.7)
Eosinophils Relative: 1 %
HCT: 44.7 % (ref 39.0–52.0)
HEMOGLOBIN: 16.2 g/dL (ref 13.0–17.0)
LYMPHS ABS: 1.7 10*3/uL (ref 0.7–4.0)
LYMPHS PCT: 25 %
MCH: 32.1 pg (ref 26.0–34.0)
MCHC: 36.2 g/dL — ABNORMAL HIGH (ref 30.0–36.0)
MCV: 88.5 fL (ref 78.0–100.0)
MONOS PCT: 9 %
Monocytes Absolute: 0.6 10*3/uL (ref 0.1–1.0)
Neutro Abs: 4.4 10*3/uL (ref 1.7–7.7)
Neutrophils Relative %: 65 %
Platelets: 197 10*3/uL (ref 150–400)
RBC: 5.05 MIL/uL (ref 4.22–5.81)
RDW: 12 % (ref 11.5–15.5)
WBC: 6.7 10*3/uL (ref 4.0–10.5)

## 2016-06-23 LAB — IRON AND TIBC
Iron: 121 ug/dL (ref 45–182)
SATURATION RATIOS: 36 % (ref 17.9–39.5)
TIBC: 339 ug/dL (ref 250–450)
UIBC: 218 ug/dL

## 2016-06-23 LAB — FERRITIN: Ferritin: 50 ng/mL (ref 24–336)

## 2016-06-23 NOTE — Progress Notes (Signed)
Chase Roy,Chase ALLEN, MD [address] [place] [address]  Hemochromatosis  CURRENT THERAPY: Blood donation to maintain ferritin ~ 50.  INTERVAL HISTORY: Chase ForthDustin R Roy 30 y.o. 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  He denies any complaints.  Since his last lab appointment, he donated 1 units of blood per recommendations by Dr. Galen ManilaPenland based upon his lab results.  Review of Systems  Constitutional: Negative.  Negative for chills, fever and weight loss.  HENT: Negative.   Eyes: Negative.   Respiratory: Negative.  Negative for cough.   Cardiovascular: Negative.  Negative for chest pain.  Gastrointestinal: Negative.   Genitourinary: Negative.   Musculoskeletal: Negative.   Skin: Positive for rash (right bicep).  Neurological: Negative.  Negative for weakness.  Endo/Heme/Allergies: Negative.   Psychiatric/Behavioral: Negative.     Past Medical History:  Diagnosis Date  . Hemochromatosis   . Upper respiratory infection of multiple sites     Past Surgical History:  Procedure Laterality Date  . HAND SURGERY    . KNEE SURGERY    . TONSILLECTOMY  1990    Family History  Problem Relation Age of Onset  . Hypertension Mother   . Hypertension Father   . Hemochromatosis Sister   . Anemia Sister     Social History   Social History  . Marital status: Single    Spouse name: N/A  . Number of children: N/A  . Years of education: N/A   Social History Main Topics  . Smoking status: Former Smoker    Types: Cigarettes    Quit date: 11/02/2006  . Smokeless tobacco: Current User    Types: Snuff  . Alcohol use Yes  . Drug use: No  . Sexual activity: Yes   Other Topics Concern  . None   Social History Narrative  . None     PHYSICAL EXAMINATION  ECOG PERFORMANCE STATUS: 0 - Asymptomatic  Vitals:   06/23/16 1400  BP: 121/76  Pulse: 77    Resp: 16  Temp: 98.1 F (36.7 C)    GENERAL:alert, healthy, no distress, well nourished, well developed, comfortable, cooperative, smiling and unaccmompanied SKIN: skin color, texture, turgor are normal, no rashes or significant lesions HEAD: Normocephalic, No masses, lesions, tenderness or abnormalities EYES: normal, EOMI, Conjunctiva are pink and non-injected EARS: External ears normal OROPHARYNX:lips, buccal mucosa, and tongue normal and mucous membranes are moist  NECK: supple, no adenopathy, trachea midline LYMPH:  no palpable lymphadenopathy BREAST:not examined LUNGS: clear to auscultation and percussion HEART: regular rate & rhythm, no murmurs, no gallops, S1 normal and S2 normal ABDOMEN:abdomen soft, non-tender and normal bowel sounds BACK: Back symmetric, no curvature. EXTREMITIES:less then 2 second capillary refill, no joint deformities, effusion, or inflammation, no skin discoloration, no cyanosis  NEURO: alert & oriented x 3 with fluent speech, no focal motor/sensory deficits, gait normal   LABORATORY DATA: CBC    Component Value Date/Time   WBC 6.7 06/23/2016 1401   RBC 5.05 06/23/2016 1401   HGB 16.2 06/23/2016 1401   HCT 44.7 06/23/2016 1401   PLT 197 06/23/2016 1401   MCV 88.5 06/23/2016 1401   MCH 32.1 06/23/2016 1401   MCHC 36.2 (H) 06/23/2016 1401   RDW 12.0 06/23/2016 1401   LYMPHSABS 1.7 06/23/2016 1401   MONOABS 0.6 06/23/2016 1401   EOSABS 0.1 06/23/2016 1401   BASOSABS 0.0 06/23/2016 1401      Chemistry  Component Value Date/Time   NA 137 06/23/2016 1401   K 3.5 06/23/2016 1401   CL 102 06/23/2016 1401   CO2 28 06/23/2016 1401   BUN 16 06/23/2016 1401   CREATININE 1.05 06/23/2016 1401      Component Value Date/Time   CALCIUM 9.1 06/23/2016 1401   ALKPHOS 68 06/23/2016 1401   AST 24 06/23/2016 1401   ALT 31 06/23/2016 1401   BILITOT 0.9 06/23/2016 1401     Lab Results  Component Value Date   IRON 217 (H) 09/12/2014   TIBC 296  09/12/2014   FERRITIN 52 10/21/2015     PENDING LABS:   RADIOGRAPHIC STUDIES:  No results found.   PATHOLOGY:    ASSESSMENT AND PLAN:  Hemochromatosis Hemochromatosis, homozygous C282Y mutation requiring therapeutic phlebotomy/blood donations 2-3 times per year.  Labs today: CBC diff, CMET, iron/TIBC, ferritin.  I personally reviewed and went over laboratory results with the patient.  The results are noted within this dictation.  Labs in 6 months: CBC diff, CMET, iron/TIBC, ferritin.  Return in 6 months for follow-up.  He is encouraged to donate blood to the red cross 2-3 times per year.   ORDERS PLACED FOR THIS ENCOUNTER: No orders of the defined types were placed in this encounter.   MEDICATIONS PRESCRIBED THIS ENCOUNTER: No orders of the defined types were placed in this encounter.   THERAPY PLAN:  Ongoing observation.  All questions were answered. The patient knows to call the clinic with any problems, questions or concerns. We can certainly see the patient much sooner if necessary.  Patient and plan discussed with Dr. Loma MessingShannon Penland and she is in agreement with the aforementioned.   This note is electronically signed by: Tina GriffithsKEFALAS,Chase Dicker, PA-C 06/23/2016 3:13 PM

## 2016-06-23 NOTE — Patient Instructions (Signed)
[facility] at [facility] Discharge Instructions  RECOMMENDATIONS MADE BY THE CONSULTANT AND ANY TEST RESULTS WILL BE SENT TO YOUR REFERRING PHYSICIAN.  Labs today are good.  Iron tests are pending and we will update you when they are available. We will repeat labs in 6 months We will see you back in 6 months.  Thank you for choosing [facility] at [facility] to provide your oncology and hematology care.  To afford each patient quality time with our provider, please arrive at least 15 minutes before your scheduled appointment time.   Beginning January 23rd 2017 lab [facility] for the [facility] will be done in the  Main lab at [facility] on 1st floor. If you have a lab appointment with the Cancer Center please come in thru the  Main Entrance and check in at the main information desk  You need to re-schedule your appointment should you arrive 10 or more minutes late.  We strive to give you quality time with our providers, and arriving late affects you and other patients whose appointments are after yours.  Also, if you no show three or more times for appointments you may be dismissed from the clinic at the providers discretion.     Again, thank you for choosing [facility].  Our hope is that these requests will decrease the amount of time that you wait before being seen by our physicians.       _____________________________________________________________  Should you have questions after your visit to [facility], please contact our office at [phone] between the hours of 8:30 a.m. and 4:30 p.m.  Voicemails left after 4:30 p.m. will not be returned until the following business day.  For prescription refill requests, have your pharmacy contact our office.         Resources For Cancer Patients and their Caregivers ? American Cancer Society: Can assist with transportation, wigs, general needs, runs Look Good Feel  Better.        [phone] ? Cancer Care: Provides financial assistance, online support groups, medication/co-pay assistance.  1-800-813-HOPE 769-734-0321(4673) ? [facility] Assists [place] cancer patients and their families through emotional , educational and financial support.  ([phone] ? [place] DSS Where to apply for food stamps, Medicaid and utility assistance. (475) 482-08662230444779 ? [facility]: Transportation to medical appointments. (769)376-3344(315) 278-8412 ? Social Security Administration: May apply for disability if have a Stage IV cancer. [phone] [phone] ? [place] [facility], Disability and Transit Services: Assists with nutrition, care and transit needs. [phone]  Cancer Center Support Programs: @10RELATIVEDAYS @ > Cancer Support Group  2nd Tuesday of the month 1pm-2pm, Journey Room  > Creative Journey  3rd Tuesday of the month 1130am-1pm, Journey Room  > Look Good Feel Better  1st Wednesday of the month 10am-12 noon, Journey Room (Call American Cancer Society to register [phone])

## 2016-06-23 NOTE — Assessment & Plan Note (Signed)
Hemochromatosis, homozygous C282Y mutation requiring therapeutic phlebotomy/blood donations 2-3 times per year.  Labs today: CBC diff, CMET, iron/TIBC, ferritin.  I personally reviewed and went over laboratory results with the patient.  The results are noted within this dictation.  Labs in 6 months: CBC diff, CMET, iron/TIBC, ferritin.  Return in 6 months for follow-up.  He is encouraged to donate blood to the red cross 2-3 times per year.

## 2016-12-23 ENCOUNTER — Other Ambulatory Visit (HOSPITAL_COMMUNITY): Payer: Self-pay | Admitting: *Deleted

## 2016-12-24 ENCOUNTER — Encounter (HOSPITAL_COMMUNITY): Payer: 59 | Attending: Oncology | Admitting: Oncology

## 2016-12-24 ENCOUNTER — Encounter (HOSPITAL_COMMUNITY): Payer: 59

## 2016-12-24 ENCOUNTER — Encounter (HOSPITAL_COMMUNITY): Payer: Self-pay

## 2016-12-24 LAB — CBC WITH DIFFERENTIAL/PLATELET
Basophils Absolute: 0 10*3/uL (ref 0.0–0.1)
Basophils Relative: 1 %
EOS ABS: 0.1 10*3/uL (ref 0.0–0.7)
EOS PCT: 2 %
HCT: 45 % (ref 39.0–52.0)
HEMOGLOBIN: 16.3 g/dL (ref 13.0–17.0)
LYMPHS ABS: 1.6 10*3/uL (ref 0.7–4.0)
LYMPHS PCT: 46 %
MCH: 32.1 pg (ref 26.0–34.0)
MCHC: 36.2 g/dL — AB (ref 30.0–36.0)
MCV: 88.8 fL (ref 78.0–100.0)
MONOS PCT: 13 %
Monocytes Absolute: 0.5 10*3/uL (ref 0.1–1.0)
Neutro Abs: 1.4 10*3/uL — ABNORMAL LOW (ref 1.7–7.7)
Neutrophils Relative %: 38 %
PLATELETS: 156 10*3/uL (ref 150–400)
RBC: 5.07 MIL/uL (ref 4.22–5.81)
RDW: 12.2 % (ref 11.5–15.5)
WBC: 3.5 10*3/uL — ABNORMAL LOW (ref 4.0–10.5)

## 2016-12-24 LAB — COMPREHENSIVE METABOLIC PANEL
ALK PHOS: 77 U/L (ref 38–126)
ALT: 37 U/L (ref 17–63)
ANION GAP: 7 (ref 5–15)
AST: 28 U/L (ref 15–41)
Albumin: 4.4 g/dL (ref 3.5–5.0)
BUN: 14 mg/dL (ref 6–20)
CALCIUM: 9.5 mg/dL (ref 8.9–10.3)
CO2: 29 mmol/L (ref 22–32)
CREATININE: 0.99 mg/dL (ref 0.61–1.24)
Chloride: 101 mmol/L (ref 101–111)
Glucose, Bld: 92 mg/dL (ref 65–99)
Potassium: 4.1 mmol/L (ref 3.5–5.1)
SODIUM: 137 mmol/L (ref 135–145)
Total Bilirubin: 1.1 mg/dL (ref 0.3–1.2)
Total Protein: 7.4 g/dL (ref 6.5–8.1)

## 2016-12-24 LAB — IRON AND TIBC
IRON: 201 ug/dL — AB (ref 45–182)
SATURATION RATIOS: 61 % — AB (ref 17.9–39.5)
TIBC: 328 ug/dL (ref 250–450)
UIBC: 127 ug/dL

## 2016-12-24 LAB — FERRITIN: Ferritin: 42 ng/mL (ref 24–336)

## 2016-12-24 NOTE — Progress Notes (Signed)
Evette GeorgesDD,JEFFREY ALLEN, MD [address] [place] [address]  No diagnosis found.  CURRENT THERAPY: Blood donation to maintain ferritin ~ 50.  INTERVAL HISTORY: Ginny ForthDustin R Fehr 31 y.o. 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  Patient is here for follow up and doing well. The last time he donated blood was this past Tuesday, 12/21/16.  He has no complaints today and feels well. He denies any issues, chest pain, abdominal pain, or shortness of breath. He is eating well.  He has been donating blood twice a year.   Review of Systems  Constitutional: Negative.  Negative for chills, fever and weight loss.  HENT: Negative.   Eyes: Negative.   Respiratory: Negative.  Negative for cough and shortness of breath.   Cardiovascular: Negative.  Negative for chest pain.  Gastrointestinal: Negative.   Genitourinary: Negative.   Musculoskeletal: Negative.   Neurological: Negative.  Negative for weakness.  Endo/Heme/Allergies: Negative.   Psychiatric/Behavioral: Negative.     Past Medical History:  Diagnosis Date  . Hemochromatosis   . Upper respiratory infection of multiple sites     Past Surgical History:  Procedure Laterality Date  . HAND SURGERY    . KNEE SURGERY    . TONSILLECTOMY  1990    Family History  Problem Relation Age of Onset  . Hypertension Mother   . Hypertension Father   . Hemochromatosis Sister   . Anemia Sister     Social History   Social History  . Marital status: Single    Spouse name: N/A  . Number of children: N/A  . Years of education: N/A   Social History Main Topics  . Smoking status: Former Smoker    Types: Cigarettes    Quit date: 11/02/2006  . Smokeless tobacco: Current User    Types: Snuff  . Alcohol use Yes  . Drug use: No  . Sexual activity: Yes   Other Topics Concern  . None   Social History Narrative  . None     PHYSICAL EXAMINATION  ECOG PERFORMANCE STATUS: 0 - Asymptomatic  Vitals:   12/24/16 1007  BP: 135/90  Pulse: 72  Resp: 18  Temp: 97.8 F (36.6 C)    Physical Exam  Constitutional: He is oriented to person, place, and time and well-developed, well-nourished, and in no distress.  HENT:  Head: Normocephalic and atraumatic.  Mouth/Throat: Oropharynx is clear and moist.  Eyes: Conjunctivae and EOM are normal. Pupils are equal, round, and reactive to light.  Neck: Normal range of motion. Neck supple.  Cardiovascular: Normal rate, regular rhythm and normal heart sounds.   Pulmonary/Chest: Effort normal and breath sounds normal.  Abdominal: Soft. Bowel sounds are normal.  Musculoskeletal: Normal range of motion.  Neurological: He is alert and oriented to person, place, and time. Gait normal.  Skin: Skin is warm and dry.  Nursing note and vitals reviewed.   LABORATORY DATA: I have reviewed the data as listed. CBC    Component Value Date/Time   WBC 6.7 06/23/2016 1401   RBC 5.05 06/23/2016 1401   HGB 16.2 06/23/2016 1401   HCT 44.7 06/23/2016 1401   PLT 197 06/23/2016 1401   MCV 88.5 06/23/2016 1401   MCH 32.1 06/23/2016 1401   MCHC 36.2 (H) 06/23/2016 1401   RDW 12.0 06/23/2016 1401   LYMPHSABS 1.7 06/23/2016 1401   MONOABS 0.6 06/23/2016 1401   EOSABS 0.1 06/23/2016  1401   BASOSABS 0.0 06/23/2016 1401      Chemistry      Component Value Date/Time   NA 137 06/23/2016 1401   K 3.5 06/23/2016 1401   CL 102 06/23/2016 1401   CO2 28 06/23/2016 1401   BUN 16 06/23/2016 1401   CREATININE 1.05 06/23/2016 1401      Component Value Date/Time   CALCIUM 9.1 06/23/2016 1401   ALKPHOS 68 06/23/2016 1401   AST 24 06/23/2016 1401   ALT 31 06/23/2016 1401   BILITOT 0.9 06/23/2016 1401     Lab Results  Component Value Date   IRON 121 06/23/2016   TIBC 339 06/23/2016   FERRITIN 50 06/23/2016     PENDING LABS:   RADIOGRAPHIC STUDIES:  No results  found.   PATHOLOGY:    ASSESSMENT AND PLAN:  Hematochromatosis controlled with blood donation 2-3 times a year. Clinically asymptomatic without evidence of end organ damage.  Labs will be drawn after our visit today. We will call with these results.  Goal is to maintain his ferritin <50.  He will return to clinic for follow up in 6 months with repeat labs, CBC, CMP, iron studies.   ORDERS PLACED FOR THIS ENCOUNTER: No orders of the defined types were placed in this encounter.   MEDICATIONS PRESCRIBED THIS ENCOUNTER: No orders of the defined types were placed in this encounter.   THERAPY PLAN:  Ongoing observation.  All questions were answered. The patient knows to call the clinic with any problems, questions or concerns. We can certainly see the patient much sooner if necessary.  This document serves as a record of services personally performed by Ralene Cork, MD. It was created on her behalf by Delana Meyer, a trained medical scribe. The creation of this record is based on the scribe's personal observations and the provider's statements to them. This document has been checked and approved by the attending provider.  I have reviewed the above documentation for accuracy and completeness and I agree with the above.  This note is electronically signed by: Jed Limerick 12/24/2016 10:16 AM

## 2016-12-24 NOTE — Patient Instructions (Signed)
[facility] at [facility] Discharge Instructions  RECOMMENDATIONS MADE BY THE CONSULTANT AND ANY TEST RESULTS WILL BE SENT TO YOUR REFERRING PHYSICIAN.  You were seen today by Dr. Louis Zhou  Follow up in 6 months with labwork  See Amy up front for appointments   Thank you for choosing [facility] at [facility] to provide your oncology and hematology care.  To afford each patient quality time with our provider, please arrive at least 15 minutes before your scheduled appointment time.    If you have a lab appointment with the Cancer Center please come in thru the  Main Entrance and check in at the main information desk  You need to re-schedule your appointment should you arrive 10 or more minutes late.  We strive to give you quality time with our providers, and arriving late affects you and other patients whose appointments are after yours.  Also, if you no show three or more times for appointments you may be dismissed from the clinic at the providers discretion.     Again, thank you for choosing [facility].  Our hope is that these requests will decrease the amount of time that you wait before being seen by our physicians.       _____________________________________________________________  Should you have questions after your visit to [facility], please contact our office at [phone] between the hours of 8:30 a.m. and 4:30 p.m.  Voicemails left after 4:30 p.m. will not be returned until the following business day.  For prescription refill requests, have your pharmacy contact our office.       Resources For Cancer Patients and their Caregivers ? American Cancer Society: Can assist with transportation, wigs, general needs, runs Look Good Feel Better.        1-888-227-6333 ? Cancer Care: Provides financial assistance, online support groups, medication/co-pay assistance.  1-800-813-HOPE (4673) ? [facility] Assists [place] cancer patients and their families through emotional , educational and financial support.  336-427-4357 ? [place] DSS Where to apply for food stamps, Medicaid and utility assistance. 336-342-1394 ? RCATS: Transportation to medical appointments. 336-347-2287 ? Social Security Administration: May apply for disability if have a Stage IV cancer. 336-342-7796 1-800-772-1213 ? [place] Aging, Disability and Transit Services: Assists with nutrition, care and transit needs. 336-349-2343  Cancer Center Support Programs: @[email or web address]@ > Cancer Support Group  2nd Tuesday of the month 1pm-2pm, Journey Room  > Creative Journey  3rd Tuesday of the month 1130am-1pm, Journey Room  > Look Good Feel Better  1st Wednesday of the month 10am-12 noon, Journey Room (Call American Cancer Society to register 1-800-395-5775)    

## 2017-04-06 ENCOUNTER — Ambulatory Visit (INDEPENDENT_AMBULATORY_CARE_PROVIDER_SITE_OTHER): Payer: 59 | Admitting: Adult Health

## 2017-04-06 ENCOUNTER — Encounter: Payer: Self-pay | Admitting: Adult Health

## 2017-04-06 VITALS — BP 140/82 | Temp 98.7°F | Ht 67.62 in | Wt 157.6 lb

## 2017-04-06 DIAGNOSIS — Z0000 Encounter for general adult medical examination without abnormal findings: Secondary | ICD-10-CM

## 2017-04-06 DIAGNOSIS — I10 Essential (primary) hypertension: Secondary | ICD-10-CM | POA: Diagnosis not present

## 2017-04-06 DIAGNOSIS — Z23 Encounter for immunization: Secondary | ICD-10-CM

## 2017-04-06 LAB — BASIC METABOLIC PANEL
BUN: 17 mg/dL (ref 6–23)
CO2: 29 meq/L (ref 19–32)
Calcium: 9.6 mg/dL (ref 8.4–10.5)
Chloride: 102 mEq/L (ref 96–112)
Creatinine, Ser: 1.11 mg/dL (ref 0.40–1.50)
GFR: 81.65 mL/min (ref >60.00)
GLUCOSE: 85 mg/dL (ref 70–99)
POTASSIUM: 4 meq/L (ref 3.5–5.1)
SODIUM: 138 meq/L (ref 135–145)

## 2017-04-06 LAB — CBC WITH DIFFERENTIAL/PLATELET
BASOS ABS: 0 10*3/uL (ref 0.0–0.1)
Basophils Relative: 1 % (ref 0.0–3.0)
EOS PCT: 2.9 % (ref 0.0–5.0)
Eosinophils Absolute: 0.1 10*3/uL (ref 0.0–0.7)
HEMATOCRIT: 50.1 % (ref 39.0–52.0)
Hemoglobin: 17.6 g/dL — ABNORMAL HIGH (ref 13.0–17.0)
LYMPHS ABS: 1.6 10*3/uL (ref 0.7–4.0)
LYMPHS PCT: 40.5 % (ref 12.0–46.0)
MCHC: 35.2 g/dL (ref 30.0–36.0)
MCV: 90.2 fl (ref 78.0–100.0)
MONOS PCT: 12.2 % — AB (ref 3.0–12.0)
Monocytes Absolute: 0.5 10*3/uL (ref 0.1–1.0)
NEUTROS ABS: 1.7 10*3/uL (ref 1.4–7.7)
NEUTROS PCT: 43.4 % (ref 43.0–77.0)
PLATELETS: 156 10*3/uL (ref 150.0–400.0)
RBC: 5.55 Mil/uL (ref 4.22–5.81)
RDW: 12.4 % (ref 11.5–15.5)
WBC: 4 10*3/uL (ref 4.0–10.5)

## 2017-04-06 LAB — HEPATIC FUNCTION PANEL
ALBUMIN: 4.8 g/dL (ref 3.5–5.2)
ALT: 30 U/L (ref 0–53)
AST: 22 U/L (ref 0–37)
Alkaline Phosphatase: 88 U/L (ref 39–117)
Bilirubin, Direct: 0.2 mg/dL (ref 0.0–0.3)
TOTAL PROTEIN: 7.3 g/dL (ref 6.0–8.3)
Total Bilirubin: 0.5 mg/dL (ref 0.2–1.2)

## 2017-04-06 LAB — LIPID PANEL
CHOL/HDL RATIO: 3
Cholesterol: 163 mg/dL (ref 0–200)
HDL: 52.8 mg/dL (ref >39.00)
LDL Cholesterol: 93 mg/dL (ref 0–99)
NONHDL: 110.68
Triglycerides: 89 mg/dL (ref 0.0–149.0)
VLDL: 17.8 mg/dL (ref 0.0–40.0)

## 2017-04-06 LAB — TSH: TSH: 1.05 u[IU]/mL (ref 0.35–4.50)

## 2017-04-06 MED ORDER — LISINOPRIL 2.5 MG PO TABS: 2.5000 mg | ORAL_TABLET | Freq: Every day | ORAL | 3 refills | Status: DC

## 2017-04-06 NOTE — Progress Notes (Signed)
Patient presents to clinic today to establish care. He is a pleasant 31 year old male who  has a past medical history of Hemochromatosis and Upper respiratory infection of multiple sites.   He is a former patient of Dr. Tawanna Cooler, with whom he last saw in 2012.    Acute Concerns: Establish Care/CPE  Chronic Issues: Hemochromatosis - is followed by [facility]  Elevated blood pressure readings - have been steadily rising. He has a strong family history of hypertension. He eats well and is active   Health Maintenance: Dental -- Routine  Vision -- Routine  Immunizations -- He needs a Tdap  Diet: Eats healthy  Exercise: Exercises at the gym 2 times per week. Works for [employer]   Past Medical History:  Diagnosis Date  . Hemochromatosis   . Upper respiratory infection of multiple sites     Past Surgical History:  Procedure Laterality Date  . HAND SURGERY    . KNEE SURGERY    . TONSILLECTOMY  1990    Current Outpatient Prescriptions on File Prior to Visit  Medication Sig Dispense Refill  . Ibuprofen (MOTRIN PO) Take by mouth as needed.    . loratadine (CLARITIN) 10 MG tablet Take 10 mg by mouth daily.     No current facility-administered medications on file prior to visit.     No Known Allergies  Family History  Problem Relation Age of Onset  . Hypertension Mother   . Hypertension Father   . Hemochromatosis Sister   . Anemia Sister     Social History   Social History  . Marital status: Single    Spouse name: N/A  . Number of children: N/A  . Years of education: N/A   Occupational History  . Not on file.   Social History Main Topics  . Smoking status: Former Smoker    Types: Cigarettes    Quit date: 11/02/2006  . Smokeless tobacco: Current User    Types: Snuff  . Alcohol use Yes  . Drug use: No  . Sexual activity: Yes   Other Topics Concern  . Not on file   Social History Narrative  . No narrative on file    Review of Systems  Constitutional: Negative.     HENT: Negative.   Eyes: Negative.   Respiratory: Negative.   Cardiovascular: Negative.   Gastrointestinal: Negative.   Genitourinary: Negative.   Musculoskeletal: Negative.   Skin: Negative.   Neurological: Negative.   Endo/Heme/Allergies: Negative.   Psychiatric/Behavioral: Negative.     BP 140/82   Temp 98.7 F (37.1 C) (Oral)   Ht 5' 7.62" (1.718 m)   Wt 157 lb 9.6 oz (71.5 kg)   BMI 24.23 kg/m   Physical Exam  Constitutional: He is oriented to person, place, and time and well-developed, well-nourished, and in no distress. No distress.  HENT:  Head: Normocephalic and atraumatic.  Right Ear: External ear normal.  Left Ear: External ear normal.  Nose: Nose normal.  Mouth/Throat: Oropharynx is clear and moist. No oropharyngeal exudate.  Eyes: Conjunctivae and EOM are normal. Pupils are equal, round, and reactive to light. Right eye exhibits no discharge. Left eye exhibits no discharge. No scleral icterus.  Neck: Normal range of motion. Neck supple. No JVD present. No tracheal deviation present. No thyromegaly present.  Cardiovascular: Normal rate, regular rhythm, normal heart sounds and intact distal pulses.  Exam reveals no gallop and no friction rub.   No murmur heard. Pulmonary/Chest: Effort normal and breath sounds  normal. No stridor. No respiratory distress. He has no wheezes. He has no rales. He exhibits no tenderness.  Abdominal: Soft. Bowel sounds are normal. He exhibits no distension and no mass. There is no tenderness. There is no rebound and no guarding.  Musculoskeletal: Normal range of motion. He exhibits no edema, tenderness or deformity.  Lymphadenopathy:    He has no cervical adenopathy.  Neurological: He is alert and oriented to person, place, and time. He displays normal reflexes. No cranial nerve deficit. He exhibits normal muscle tone. Gait normal. Coordination normal. GCS score is 15.  Skin: Skin is warm and dry. No rash noted. He is not diaphoretic. No  erythema. No pallor.  Psychiatric: Mood, memory, affect and judgment normal.  Nursing note and vitals reviewed.   Assessment/Plan: 1. Routine general medical examination at a health care facility - Tdap given  - Basic metabolic panel - CBC with Differential/Platelet - Lipid panel - TSH - Hepatic function panel - Follow up in 1-2 years for CPE   2. Essential hypertension - Will start on low dose lisinopril. Advised to monitor BP at different times throughout the day  - lisinopril (ZESTRIL) 2.5 MG tablet; Take 1 tablet (2.5 mg total) by mouth daily.  Dispense: 90 tablet; Refill: 3 - Basic metabolic panel - CBC with Differential/Platelet - Lipid panel - TSH - Hepatic function panel  Shirline Freesory Echo [place], NP

## 2017-04-06 NOTE — Patient Instructions (Signed)
It was great meeting you today   I have started you on Lisinopril 2.5 mg daily for high blood pressure. Monitor at home and you can titrate up to 5 mg if needed. Please let me know if you do this so I can adjust the medication in our system.   Follow up in 1-2 yeas for next physical or sooner if needed

## 2017-04-06 NOTE — Addendum Note (Signed)
Addended by: Janelle FloorHOMPSON, Shavonne Ambroise B on: 04/06/2017 07:50 AM   Modules accepted: Orders

## 2017-06-24 ENCOUNTER — Other Ambulatory Visit (HOSPITAL_COMMUNITY): Payer: 59

## 2017-06-24 ENCOUNTER — Ambulatory Visit (HOSPITAL_COMMUNITY): Payer: 59

## 2017-07-27 ENCOUNTER — Other Ambulatory Visit (HOSPITAL_COMMUNITY): Payer: Self-pay | Admitting: *Deleted

## 2017-07-28 ENCOUNTER — Encounter (HOSPITAL_COMMUNITY): Payer: 59

## 2017-07-28 ENCOUNTER — Encounter (HOSPITAL_COMMUNITY): Payer: 59 | Attending: Oncology | Admitting: Oncology

## 2017-07-28 DIAGNOSIS — I10 Essential (primary) hypertension: Secondary | ICD-10-CM | POA: Insufficient documentation

## 2017-07-28 DIAGNOSIS — Z87891 Personal history of nicotine dependence: Secondary | ICD-10-CM | POA: Insufficient documentation

## 2017-07-28 LAB — CBC WITH DIFFERENTIAL/PLATELET
BASOS PCT: 1 %
Basophils Absolute: 0 10*3/uL (ref 0.0–0.1)
EOS ABS: 0.1 10*3/uL (ref 0.0–0.7)
EOS PCT: 3 %
HCT: 45.3 % (ref 39.0–52.0)
Hemoglobin: 16.3 g/dL (ref 13.0–17.0)
LYMPHS ABS: 1.8 10*3/uL (ref 0.7–4.0)
Lymphocytes Relative: 43 %
MCH: 32.3 pg (ref 26.0–34.0)
MCHC: 36 g/dL (ref 30.0–36.0)
MCV: 89.9 fL (ref 78.0–100.0)
MONO ABS: 0.5 10*3/uL (ref 0.1–1.0)
MONOS PCT: 13 %
Neutro Abs: 1.6 10*3/uL — ABNORMAL LOW (ref 1.7–7.7)
Neutrophils Relative %: 40 %
Platelets: 141 10*3/uL — ABNORMAL LOW (ref 150–400)
RBC: 5.04 MIL/uL (ref 4.22–5.81)
RDW: 12 % (ref 11.5–15.5)
WBC: 4.1 10*3/uL (ref 4.0–10.5)

## 2017-07-28 LAB — COMPREHENSIVE METABOLIC PANEL
ALBUMIN: 4.3 g/dL (ref 3.5–5.0)
ALT: 34 U/L (ref 17–63)
ANION GAP: 8 (ref 5–15)
AST: 28 U/L (ref 15–41)
Alkaline Phosphatase: 71 U/L (ref 38–126)
BUN: 16 mg/dL (ref 6–20)
CALCIUM: 9.6 mg/dL (ref 8.9–10.3)
CO2: 28 mmol/L (ref 22–32)
Chloride: 100 mmol/L — ABNORMAL LOW (ref 101–111)
Creatinine, Ser: 1.04 mg/dL (ref 0.61–1.24)
GFR calc non Af Amer: >60 mL/min (ref >60)
GLUCOSE: 91 mg/dL (ref 65–99)
POTASSIUM: 3.8 mmol/L (ref 3.5–5.1)
SODIUM: 136 mmol/L (ref 135–145)
TOTAL PROTEIN: 7.1 g/dL (ref 6.5–8.1)
Total Bilirubin: 0.9 mg/dL (ref 0.3–1.2)

## 2017-07-28 LAB — IRON AND TIBC
IRON: 184 ug/dL — AB (ref 45–182)
SATURATION RATIOS: 56 % — AB (ref 17.9–39.5)
TIBC: 326 ug/dL (ref 250–450)
UIBC: 142 ug/dL

## 2017-07-28 LAB — FERRITIN: Ferritin: 18 ng/mL — ABNORMAL LOW (ref 24–336)

## 2017-07-28 NOTE — Progress Notes (Signed)
Chase Frees, NP [address] [place] [address]  Hereditary hemochromatosis [facility])  CURRENT THERAPY: Blood donation to maintain ferritin ~ 50.  INTERVAL HISTORY: Chase Roy 31 y.o. 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  Patient is here for follow up and doing well. The last time he donated blood in July to the [facility].  He has no complaints today and feels well. He denies any issues, chest pain, abdominal pain, or shortness of breath. He is eating well.  Since his last visit he has been diagnosed with hypertension in May and is on a low dose lisinopril.   Review of Systems  Constitutional: Negative.  Negative for chills, fever and weight loss.  HENT: Negative.   Eyes: Negative.   Respiratory: Negative.  Negative for cough and shortness of breath.   Cardiovascular: Negative.  Negative for chest pain.  Gastrointestinal: Negative.   Genitourinary: Negative.   Musculoskeletal: Negative.   Neurological: Negative.  Negative for weakness.  Endo/Heme/Allergies: Negative.   Psychiatric/Behavioral: Negative.     Past Medical History:  Diagnosis Date  . Chicken pox   . Hemochromatosis   . Upper respiratory infection of multiple sites     Past Surgical History:  Procedure Laterality Date  . HAND SURGERY    . KNEE SURGERY    . TONSILLECTOMY  1990    Family History  Problem Relation Age of Onset  . Hypertension Mother   . Hypertension Father   . Hemochromatosis Sister   . Anemia Sister     Social History   Social History  . Marital status: Divorced    Spouse name: N/A  . Number of children: N/A  . Years of education: N/A   Social History Main Topics  . Smoking status: Former Smoker    Types: Cigarettes    Quit date: 11/02/2006  . Smokeless tobacco: Former Neurosurgeon    Types: Snuff  . Alcohol use Yes  . Drug use: No  . Sexual  activity: Yes   Other Topics Concern  . Not on file   Social History Narrative  . No narrative on file     PHYSICAL EXAMINATION  ECOG PERFORMANCE STATUS: 0 - Asymptomatic  Vitals:   07/28/17 0849  BP: 125/78  Pulse: (!) 55  Resp: 16  SpO2: 100%    Physical Exam  Constitutional: He is oriented to person, place, and time and well-developed, well-nourished, and in no distress.  HENT:  Head: Normocephalic and atraumatic.  Mouth/Throat: Oropharynx is clear and moist.  Eyes: Pupils are equal, round, and reactive to light. Conjunctivae and EOM are normal.  Neck: Normal range of motion. Neck supple.  Cardiovascular: Normal rate, regular rhythm and normal heart sounds.   Pulmonary/Chest: Effort normal and breath sounds normal.  Abdominal: Soft. Bowel sounds are normal.  Musculoskeletal: Normal range of motion.  Neurological: He is alert and oriented to person, place, and time. Gait normal.  Skin: Skin is warm and dry.  Nursing note and vitals reviewed.   LABORATORY DATA: I have reviewed the data as listed. CBC    Component Value Date/Time   WBC 4.1 07/28/2017 0831   RBC 5.04 07/28/2017 0831   HGB 16.3 07/28/2017 0831   HCT 45.3 07/28/2017 0831   PLT 141 (L) 07/28/2017 0831   MCV 89.9 07/28/2017 0831   MCH 32.3 07/28/2017 0831   MCHC 36.0 07/28/2017 0831  RDW 12.0 07/28/2017 0831   LYMPHSABS 1.8 07/28/2017 0831   MONOABS 0.5 07/28/2017 0831   EOSABS 0.1 07/28/2017 0831   BASOSABS 0.0 07/28/2017 0831      Chemistry      Component Value Date/Time   NA 136 07/28/2017 0831   K 3.8 07/28/2017 0831   CL 100 (L) 07/28/2017 0831   CO2 28 07/28/2017 0831   BUN 16 07/28/2017 0831   CREATININE 1.04 07/28/2017 0831      Component Value Date/Time   CALCIUM 9.6 07/28/2017 0831   ALKPHOS 71 07/28/2017 0831   AST 28 07/28/2017 0831   ALT 34 07/28/2017 0831   BILITOT 0.9 07/28/2017 0831     Lab Results  Component Value Date   IRON 184 (H) 07/28/2017   TIBC 326  07/28/2017   FERRITIN 18 (L) 07/28/2017       ASSESSMENT AND PLAN:  Hematochromatosis controlled with blood donation 2-3 times a year. Clinically asymptomatic without evidence of end organ damage.  Ferritin is 18 today. Goal is to maintain his ferritin <50.  He will return to clinic for follow up in 6 months with repeat labs, CBC, CMP, iron studies.   THERAPY PLAN:  Ongoing observation.  All questions were answered. The patient knows to call the clinic with any problems, questions or concerns. We can certainly see the patient much sooner if necessary.  This note is electronically signed by: Ralene Cork, MD 07/28/2017 2:54 PM

## 2017-07-28 NOTE — Patient Instructions (Signed)
[facility] at [facility] Discharge Instructions  RECOMMENDATIONS MADE BY THE CONSULTANT AND ANY TEST RESULTS WILL BE SENT TO YOUR REFERRING PHYSICIAN.  Your were seen by Dr. Zhou today.  Thank you for choosing [facility] at [facility] to provide your oncology and hematology care.  To afford each patient quality time with our provider, please arrive at least 15 minutes before your scheduled appointment time.    If you have a lab appointment with the Cancer Center please come in thru the  Main Entrance and check in at the main information desk  You need to re-schedule your appointment should you arrive 10 or more minutes late.  We strive to give you quality time with our providers, and arriving late affects you and other patients whose appointments are after yours.  Also, if you no show three or more times for appointments you may be dismissed from the clinic at the providers discretion.     Again, thank you for choosing [facility].  Our hope is that these requests will decrease the amount of time that you wait before being seen by our physicians.       _____________________________________________________________  Should you have questions after your visit to  Cancer Center, please contact our office at [phone] between the hours of 8:30 a.m. and 4:30 p.m.  Voicemails left after 4:30 p.m. will not be returned until the following business day.  For prescription refill requests, have your pharmacy contact our office.       Resources For Cancer Patients and their Caregivers ? American Cancer Society: Can assist with transportation, wigs, general needs, runs Look Good Feel Better.        1-888-227-6333 ? Cancer Care: Provides financial assistance, online support groups, medication/co-pay assistance.  1-800-813-HOPE (4673) ? [facility] Assists [place] cancer patients and their families  through emotional , educational and financial support.  336-427-4357 ? [place] DSS Where to apply for food stamps, Medicaid and utility assistance. 336-342-1394 ? RCATS: Transportation to medical appointments. 336-347-2287 ? Social Security Administration: May apply for disability if have a Stage IV cancer. 336-342-7796 1-800-772-1213 ? [place] Aging, Disability and Transit Services: Assists with nutrition, care and transit needs. 336-349-2343  Cancer Center Support Programs: @10RELATIVEDAYS@ > Cancer Support Group  2nd Tuesday of the month 1pm-2pm, Journey Room  > Creative Journey  3rd Tuesday of the month 1130am-1pm, Journey Room  > Look Good Feel Better  1st Wednesday of the month 10am-12 noon, Journey Room (Call American Cancer Society to register 1-800-395-5775)   

## 2017-12-28 DIAGNOSIS — M25512 Pain in left shoulder: Secondary | ICD-10-CM | POA: Diagnosis not present

## 2018-01-16 ENCOUNTER — Inpatient Hospital Stay (HOSPITAL_COMMUNITY): Payer: 59 | Attending: Internal Medicine

## 2018-01-16 DIAGNOSIS — Z79899 Other long term (current) drug therapy: Secondary | ICD-10-CM | POA: Diagnosis not present

## 2018-01-16 LAB — CBC WITH DIFFERENTIAL/PLATELET
BASOS PCT: 1 %
Basophils Absolute: 0 10*3/uL (ref 0.0–0.1)
Eosinophils Absolute: 0.1 10*3/uL (ref 0.0–0.7)
Eosinophils Relative: 3 %
HEMATOCRIT: 46.8 % (ref 39.0–52.0)
HEMOGLOBIN: 16.4 g/dL (ref 13.0–17.0)
Lymphocytes Relative: 43 %
Lymphs Abs: 1.6 10*3/uL (ref 0.7–4.0)
MCH: 31.4 pg (ref 26.0–34.0)
MCHC: 35 g/dL (ref 30.0–36.0)
MCV: 89.5 fL (ref 78.0–100.0)
MONOS PCT: 10 %
Monocytes Absolute: 0.4 10*3/uL (ref 0.1–1.0)
NEUTROS ABS: 1.6 10*3/uL — AB (ref 1.7–7.7)
NEUTROS PCT: 43 %
Platelets: 141 10*3/uL — ABNORMAL LOW (ref 150–400)
RBC: 5.23 MIL/uL (ref 4.22–5.81)
RDW: 11.9 % (ref 11.5–15.5)
WBC: 3.7 10*3/uL — ABNORMAL LOW (ref 4.0–10.5)

## 2018-01-16 LAB — COMPREHENSIVE METABOLIC PANEL
ALBUMIN: 4.4 g/dL (ref 3.5–5.0)
ALK PHOS: 77 U/L (ref 38–126)
ALT: 40 U/L (ref 17–63)
ANION GAP: 11 (ref 5–15)
AST: 31 U/L (ref 15–41)
BILIRUBIN TOTAL: 1.2 mg/dL (ref 0.3–1.2)
BUN: 16 mg/dL (ref 6–20)
CALCIUM: 9.6 mg/dL (ref 8.9–10.3)
CO2: 23 mmol/L (ref 22–32)
CREATININE: 0.99 mg/dL (ref 0.61–1.24)
Chloride: 103 mmol/L (ref 101–111)
GFR calc Af Amer: >60 mL/min (ref >60)
GFR calc non Af Amer: >60 mL/min (ref >60)
GLUCOSE: 99 mg/dL (ref 65–99)
Potassium: 3.8 mmol/L (ref 3.5–5.1)
Sodium: 137 mmol/L (ref 135–145)
TOTAL PROTEIN: 7.5 g/dL (ref 6.5–8.1)

## 2018-01-16 LAB — IRON AND TIBC
Iron: 253 ug/dL — ABNORMAL HIGH (ref 45–182)
Saturation Ratios: 87 % — ABNORMAL HIGH (ref 17.9–39.5)
TIBC: 291 ug/dL (ref 250–450)
UIBC: 38 ug/dL

## 2018-01-16 LAB — FERRITIN: Ferritin: 51 ng/mL (ref 24–336)

## 2018-01-18 ENCOUNTER — Other Ambulatory Visit (HOSPITAL_COMMUNITY): Payer: 59

## 2018-01-25 ENCOUNTER — Encounter (HOSPITAL_COMMUNITY): Payer: Self-pay | Admitting: Internal Medicine

## 2018-01-25 ENCOUNTER — Other Ambulatory Visit: Payer: Self-pay

## 2018-01-25 ENCOUNTER — Inpatient Hospital Stay (HOSPITAL_BASED_OUTPATIENT_CLINIC_OR_DEPARTMENT_OTHER): Payer: 59 | Admitting: Internal Medicine

## 2018-01-25 NOTE — Patient Instructions (Addendum)
[facility] Discharge Instructions  You were seen today by Dr. Melton AlarHiggs. She discussed with you about donating blood and making sure that they know you have hemachromatosis. She sent over your recent lab results and it looks like you need to have a phlebotomy soon. After calling the [facility] it looks like you should not donate blood. We will see you back in 6 months for labs and follow up.    Thank you for choosing [facility] at [facility] to provide your oncology and hematology care.  To afford each patient quality time with our provider, please arrive at least 15 minutes before your scheduled appointment time.   If you have a lab appointment with the Cancer Center please come in thru the  Main Entrance and check in at the main information desk  You need to re-schedule your appointment should you arrive 10 or more minutes late.  We strive to give you quality time with our providers, and arriving late affects you and other patients whose appointments are after yours.  Also, if you no show three or more times for appointments you may be dismissed from the clinic at the providers discretion.     Again, thank you for choosing [facility].  Our hope is that these requests will decrease the amount of time that you wait before being seen by our physicians.       _____________________________________________________________  Should you have questions after your visit to [facility], please contact our office at [phone] between the hours of 8:30 a.m. and 4:30 p.m.  Voicemails left after 4:30 p.m. will not be returned until the following business day.  For prescription refill requests, have your pharmacy contact our office.       Resources For Cancer Patients and their Caregivers ? American Cancer Society: Can assist with transportation, wigs, general needs, runs Look Good Feel Better.         [phone] ? Cancer Care: Provides financial assistance, online support groups, medication/co-pay assistance.  1-800-813-HOPE 616-860-2146(4673) ? [facility] Assists [place] cancer patients and their families through emotional , educational and financial support.  403-459-5486(432)869-1329 ? [place] DSS Where to apply for food stamps, Medicaid and utility assistance. 872-771-2980513-130-7359 ? RCATS: Transportation to medical appointments. 5202024649401-130-6342 ? Social Security Administration: May apply for disability if have a Stage IV cancer. [phone] 351-832-04071-(707)237-6130 ? [place] A[place], Disability and Transit Services: Assists with nutrition, care and transit needs. [phone](912)[phone]  Cancer Center Support Programs:   > Cancer Support Group  2nd Tuesday of the month 1pm-2pm, Journey Room   > Creative Journey  3rd Tuesday of the month 1130am-1pm, Journey Room

## 2018-01-25 NOTE — Progress Notes (Signed)
Diagnosis Hereditary hemochromatosis ([place]) - Plan: CBC with Differential/Platelet, Comprehensive metabolic panel, Ferritin, Phlebotomy therapeutic  Staging Cancer Staging No matching staging information was found for the patient.  Assessment and Plan: 1. Hematochromatosis.  Pt was followed by Dr. Talbert Cage.  His condition was controlled with blood donation 2-3 times a year. Clinically asymptomatic without evidence of end organ damage.  Labs done 01/16/2018 showed a hemoglobin 16.4 and ferritin of 51.  I have discussed with him today that based on the history of hemochromatosis he should not be donating blood.  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  I have also confirmed this with [facility].  Have discussed with him if he does donate he would have to notify them regarding his diagnosis to determine if they would perform therapeutic phlebotomies where the blood will be discarded.  He will return to clinic in 6 months for repeat labs with CBC CMP and ferritin.  Liver function tests are within normal limits.  Goal is to maintain his ferritin <50.  INTERVAL HISTORY: Gurveer Colucci 32 y.o. 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  Current Status: Patient is seen today for follow-up.  He reports he plan to donate at his son's Blood Drive in April.  He is here today to go over labs.  He denies any complaints today.  Problem List Patient Active Problem List   Diagnosis Date Noted  . Hemochromatosis [E83.119] 06/26/2013  . Routine general medical examination at a health care facility [Z00.00] 09/20/2011  . Iron storage disease [E83.19] 09/20/2011    Past Medical History Past Medical History:  Diagnosis Date  . Chicken pox   . Hemochromatosis   . Upper respiratory infection of multiple sites     Past Surgical History Past Surgical History:  Procedure  Laterality Date  . HAND SURGERY    . KNEE SURGERY    . TONSILLECTOMY  1990    Family History Family History  Problem Relation Age of Onset  . Hypertension Mother   . Hypertension Father   . Hemochromatosis Sister   . Anemia Sister      Social History  reports that he quit smoking about 11 years ago. His smoking use included cigarettes. He has quit using smokeless tobacco. His smokeless tobacco use included snuff. He reports that he drinks alcohol. He reports that he does not use drugs.  Medications  Current Outpatient Medications:  .  Ibuprofen (MOTRIN PO), Take by mouth as needed., Disp: , Rfl:  .  lisinopril (ZESTRIL) 2.5 MG tablet, Take 1 tablet (2.5 mg total) by mouth daily., Disp: 90 tablet, Rfl: 3 .  loratadine (CLARITIN) 10 MG tablet, Take 10 mg by mouth daily., Disp: , Rfl:   Allergies Patient has no known allergies.  Review of Systems Review of Systems - Oncology ROS as per HPI otherwise 12 point ROS is negative.   Physical Exam  Vitals Wt Readings from Last 3 Encounters:  01/25/18 165 lb 14.4 oz (75.3 kg)  07/28/17 162 lb (73.5 kg)  04/06/17 157 lb 9.6 oz (71.5 kg)   Temp Readings from Last 3 Encounters:  01/25/18 98.7 F (37.1 C) (Oral)  04/06/17 98.7 F (37.1 C) (Oral)  12/24/16 97.8 F (36.6 C) (Oral)   BP Readings from Last 3 Encounters:  01/25/18 (!) 143/97  07/28/17 125/78  04/06/17 140/82   Pulse Readings from Last 3  Encounters:  01/25/18 (!) 59  07/28/17 (!) 55  12/24/16 72   Constitutional: Well-developed, well-nourished, and in no distress.   HENT: Head: Normocephalic and atraumatic.  Mouth/Throat: No oropharyngeal exudate. Mucosa moist. Eyes: Pupils are equal, round, and reactive to light. Conjunctivae are normal. No scleral icterus.  Neck: Normal range of motion. Neck supple. No JVD present.  Cardiovascular: Normal rate, regular rhythm and normal heart sounds.  Exam reveals no gallop and no friction rub.   No murmur  heard. Pulmonary/Chest: Effort normal and breath sounds normal. No respiratory distress. No wheezes.No rales.  Abdominal: Soft. Bowel sounds are normal. No distension. There is no tenderness. There is no guarding.  Musculoskeletal: No edema or tenderness.  Lymphadenopathy: No cervical, axillary or supraclavicular adenopathy.  Neurological: Alert and oriented to person, place, and time. No cranial nerve deficit.  Skin: Skin is warm and dry. No rash noted. No erythema. No pallor.  Psychiatric: Affect and judgment normal.   Labs No visits with results within 3 Day(s) from this visit.  Latest known visit with results is:  Appointment on 01/16/2018  Component Date Value Ref Range Status  . WBC 01/16/2018 3.7* 4.0 - 10.5 K/uL Final  . RBC 01/16/2018 5.23  4.22 - 5.81 MIL/uL Final  . Hemoglobin 01/16/2018 16.4  13.0 - 17.0 g/dL Final  . HCT 01/16/2018 46.8  39.0 - 52.0 % Final  . MCV 01/16/2018 89.5  78.0 - 100.0 fL Final  . MCH 01/16/2018 31.4  26.0 - 34.0 pg Final  . MCHC 01/16/2018 35.0  30.0 - 36.0 g/dL Final  . RDW 01/16/2018 11.9  11.5 - 15.5 % Final  . Platelets 01/16/2018 141* 150 - 400 K/uL Final  . Neutrophils Relative % 01/16/2018 43  % Final  . Neutro Abs 01/16/2018 1.6* 1.7 - 7.7 K/uL Final  . Lymphocytes Relative 01/16/2018 43  % Final  . Lymphs Abs 01/16/2018 1.6  0.7 - 4.0 K/uL Final  . Monocytes Relative 01/16/2018 10  % Final  . Monocytes Absolute 01/16/2018 0.4  0.1 - 1.0 K/uL Final  . Eosinophils Relative 01/16/2018 3  % Final  . Eosinophils Absolute 01/16/2018 0.1  0.0 - 0.7 K/uL Final  . Basophils Relative 01/16/2018 1  % Final  . Basophils Absolute 01/16/2018 0.0  0.0 - 0.1 K/uL Final   Performed at [facility], [address] [place], [place] [address]  . Sodium 01/16/2018 137  135 - 145 mmol/L Final  . Potassium 01/16/2018 3.8  3.5 - 5.1 mmol/L Final  . Chloride 01/16/2018 103  101 - 111 mmol/L Final  . CO2 01/16/2018 23  22 - 32 mmol/L Final  . Glucose, Bld  01/16/2018 99  65 - 99 mg/dL Final  . BUN 01/16/2018 16  6 - 20 mg/dL Final  . Creatinine, Ser 01/16/2018 0.99  0.61 - 1.24 mg/dL Final  . Calcium 01/16/2018 9.6  8.9 - 10.3 mg/dL Final  . Total Protein 01/16/2018 7.5  6.5 - 8.1 g/dL Final  . Albumin 01/16/2018 4.4  3.5 - 5.0 g/dL Final  . AST 01/16/2018 31  15 - 41 U/L Final  . ALT 01/16/2018 40  17 - 63 U/L Final  . Alkaline Phosphatase 01/16/2018 77  38 - 126 U/L Final  . Total Bilirubin 01/16/2018 1.2  0.3 - 1.2 mg/dL Final  . GFR calc non Af Amer 01/16/2018 >60  >60 mL/min Final  . GFR calc Af Amer 01/16/2018 >60  >60 mL/min Final   Comment: (NOTE) The  eGFR has been calculated using the CKD EPI equation. This calculation has not been validated in all clinical situations. eGFR's persistently <60 mL/min signify possible Chronic Kidney Disease.   Georgiann Hahn gap 01/16/2018 11  5 - 15 Final   Performed at [facility], [address], [place], [place] [address]  . Iron 01/16/2018 253* 45 - 182 ug/dL Final  . TIBC 01/16/2018 291  250 - 450 ug/dL Final  . Saturation Ratios 01/16/2018 87* 17.9 - 39.5 % Final  . UIBC 01/16/2018 38  ug/dL Final   Performed at [facility], [address] [place], [place] [address]  . Ferritin 01/16/2018 51  24 - 336 ng/mL Final   Performed at [facility] [address], [place], [place] [address]     Pathology Orders Placed This Encounter  Procedures  . Phlebotomy therapeutic    Standing Status:   Future    Standing Expiration Date:   01/26/2019    Scheduling Instructions:     Phlebotomize 1 unit per protocol  . CBC with Differential/Platelet    Standing Status:   Future    Standing Expiration Date:   01/26/2019  . Comprehensive metabolic panel    Standing Status:   Future    Standing Expiration Date:   01/26/2019  . Ferritin    Standing Status:   Future    Standing Expiration Date:   01/26/2019       Zoila Shutter MD

## 2018-02-08 ENCOUNTER — Encounter (HOSPITAL_COMMUNITY): Payer: Self-pay

## 2018-02-08 ENCOUNTER — Encounter (HOSPITAL_COMMUNITY)
Admission: RE | Admit: 2018-02-08 | Discharge: 2018-02-08 | Disposition: A | Discharge status: Home / Self Care | Payer: 59 | Source: Ambulatory Visit | Attending: Internal Medicine | Admitting: Internal Medicine

## 2018-02-08 NOTE — Progress Notes (Signed)
Chase Roy presents today for phlebotomy per MD orders. HGB/HCT: 16.4/46.8 Iron 253 Sat ratio 87 Phlebotomy procedure started at 0820 and ended at 0828. 500 cc removed. Patient tolerated procedure well. Stable when discharged.  No distress noted. IV needle removed intact and pressure dressing applied.

## 2018-03-21 ENCOUNTER — Other Ambulatory Visit: Payer: Self-pay | Admitting: Adult Health

## 2018-03-21 DIAGNOSIS — I10 Essential (primary) hypertension: Secondary | ICD-10-CM

## 2018-03-21 NOTE — Telephone Encounter (Signed)
Due for yearly 

## 2018-03-22 ENCOUNTER — Encounter: Payer: Self-pay | Admitting: Family Medicine

## 2018-03-22 NOTE — Telephone Encounter (Signed)
Pt due for cpx on 04/07/18.  #90 sent to the pharmacy.  Left a message for pt to return my call.  Will also send a letter.  CRM created.

## 2018-04-11 ENCOUNTER — Encounter: Payer: 59 | Admitting: Adult Health

## 2018-04-11 ENCOUNTER — Other Ambulatory Visit: Payer: 59

## 2018-04-20 ENCOUNTER — Encounter: Payer: Self-pay | Admitting: Adult Health

## 2018-04-20 ENCOUNTER — Ambulatory Visit (INDEPENDENT_AMBULATORY_CARE_PROVIDER_SITE_OTHER): Payer: 59 | Admitting: Adult Health

## 2018-04-20 VITALS — BP 106/80 | Temp 98.1°F | Ht 67.75 in | Wt 161.0 lb

## 2018-04-20 DIAGNOSIS — Z114 Encounter for screening for human immunodeficiency virus [HIV]: Secondary | ICD-10-CM | POA: Diagnosis not present

## 2018-04-20 DIAGNOSIS — Z0000 Encounter for general adult medical examination without abnormal findings: Secondary | ICD-10-CM

## 2018-04-20 LAB — BASIC METABOLIC PANEL
BUN: 15 mg/dL (ref 6–23)
CHLORIDE: 98 meq/L (ref 96–112)
CO2: 31 mEq/L (ref 19–32)
Calcium: 9.8 mg/dL (ref 8.4–10.5)
Creatinine, Ser: 1.07 mg/dL (ref 0.40–1.50)
GFR: 84.63 mL/min (ref >60.00)
Glucose, Bld: 85 mg/dL (ref 70–99)
POTASSIUM: 4 meq/L (ref 3.5–5.1)
Sodium: 138 mEq/L (ref 135–145)

## 2018-04-20 LAB — CBC WITH DIFFERENTIAL/PLATELET
BASOS PCT: 1.8 % (ref 0.0–3.0)
Basophils Absolute: 0.1 10*3/uL (ref 0.0–0.1)
EOS PCT: 3.3 % (ref 0.0–5.0)
Eosinophils Absolute: 0.2 10*3/uL (ref 0.0–0.7)
HEMATOCRIT: 48.4 % (ref 39.0–52.0)
Hemoglobin: 17.2 g/dL — ABNORMAL HIGH (ref 13.0–17.0)
LYMPHS PCT: 37.5 % (ref 12.0–46.0)
Lymphs Abs: 1.8 10*3/uL (ref 0.7–4.0)
MCHC: 35.5 g/dL (ref 30.0–36.0)
MCV: 90.9 fl (ref 78.0–100.0)
Monocytes Absolute: 0.6 10*3/uL (ref 0.1–1.0)
Monocytes Relative: 12.5 % — ABNORMAL HIGH (ref 3.0–12.0)
Neutro Abs: 2.1 10*3/uL (ref 1.4–7.7)
Neutrophils Relative %: 44.9 % (ref 43.0–77.0)
Platelets: 173 10*3/uL (ref 150.0–400.0)
RBC: 5.32 Mil/uL (ref 4.22–5.81)
RDW: 12.4 % (ref 11.5–15.5)
WBC: 4.7 10*3/uL (ref 4.0–10.5)

## 2018-04-20 LAB — LIPID PANEL
CHOLESTEROL: 205 mg/dL — AB (ref 0–200)
HDL: 65.4 mg/dL (ref >39.00)
LDL Cholesterol: 119 mg/dL — ABNORMAL HIGH (ref 0–99)
NonHDL: 139.38
TRIGLYCERIDES: 104 mg/dL (ref 0.0–149.0)
Total CHOL/HDL Ratio: 3
VLDL: 20.8 mg/dL (ref 0.0–40.0)

## 2018-04-20 LAB — HEPATIC FUNCTION PANEL
ALT: 45 U/L (ref 0–53)
AST: 26 U/L (ref 0–37)
Albumin: 4.6 g/dL (ref 3.5–5.2)
Alkaline Phosphatase: 94 U/L (ref 39–117)
Bilirubin, Direct: 0.2 mg/dL (ref 0.0–0.3)
TOTAL PROTEIN: 7.4 g/dL (ref 6.0–8.3)
Total Bilirubin: 0.7 mg/dL (ref 0.2–1.2)

## 2018-04-20 LAB — TSH: TSH: 0.89 u[IU]/mL (ref 0.35–4.50)

## 2018-04-20 NOTE — Progress Notes (Signed)
Subjective:    Patient ID: Chase Roy, male    DOB: Oct 18, 1986, 32 y.o.   MRN: [id number]  HPI  Patient presents for yearly preventative medicine examination. He is a pleasant 32 year old male who  has a past medical history of Chicken pox and Hemochromatosis. He continues to work for [employer]  Hemochromatosis -is followed by oncology.  His symptoms are controlled with therapeutic phlebotomy 2-3 times a year.  He was last seen by oncology and March 2019  Essential Hypertension -currently prescribed lisinopril 2.5 mg tabs BP Readings from Last 3 Encounters:  04/20/18 106/80  02/08/18 113/84  01/25/18 (!) 143/97   All immunizations and health maintenance protocols were reviewed with the patient and needed orders were placed.  Globin was 16.4 and ferritin was 51  Appropriate screening laboratory values were ordered for the patient including screening of hyperlipidemia, renal function and hepatic function.  Medication reconciliation,  past medical history, social history, problem list and allergies were reviewed in detail with the patient  Goals were established with regard to weight loss, exercise, and  diet in compliance with medications.  He eats a heart healthy diet and exercises at the gym 2-3 times a week  Wt Readings from Last 3 Encounters:  04/20/18 161 lb (73 kg)  01/25/18 165 lb 14.4 oz (75.3 kg)  07/28/17 162 lb (73.5 kg)   Denies any acute issues   Review of Systems  Constitutional: Negative.   HENT: Negative.   Eyes: Negative.   Respiratory: Negative.   Cardiovascular: Negative.   Gastrointestinal: Negative.   Endocrine: Negative.   Genitourinary: Negative.   Musculoskeletal: Negative.   Skin: Negative.   Allergic/Immunologic: Negative.   Neurological: Negative.   Hematological: Negative.   Psychiatric/Behavioral: Negative.   All other systems reviewed and are negative.  Past Medical History:  Diagnosis Date  . Chicken pox   . Hemochromatosis       Social History   Socioeconomic History  . Marital status: Divorced    Spouse name: Not on file  . Number of children: Not on file  . Years of education: Not on file  . Highest education level: Not on file  Occupational History  . Not on file  Social Needs  . Financial resource strain: Not on file  . Food insecurity:    Worry: Not on file    Inability: Not on file  . Transportation needs:    Medical: Not on file    Non-medical: Not on file  Tobacco Use  . Smoking status: Former Smoker    Types: Cigarettes    Last attempt to quit: 11/02/2006    Years since quitting: 11.4  . Smokeless tobacco: Former Neurosurgeon    Types: Snuff  Substance and Sexual Activity  . Alcohol use: Yes  . Drug use: No  . Sexual activity: Yes  Lifestyle  . Physical activity:    Days per week: Not on file    Minutes per session: Not on file  . Stress: Not on file  Relationships  . Social connections:    Talks on phone: Not on file    Gets together: Not on file    Attends religious service: Not on file    Active member of club or organization: Not on file    Attends meetings of clubs or organizations: Not on file    Relationship status: Not on file  . Intimate partner violence:    Fear of current or ex partner: Not  on file    Emotionally abused: Not on file    Physically abused: Not on file    Forced sexual activity: Not on file  Other Topics Concern  . Not on file  Social History Narrative  . Not on file    Past Surgical History:  Procedure Laterality Date  . HAND SURGERY    . KNEE SURGERY    . TONSILLECTOMY  1990    Family History  Problem Relation Age of Onset  . Hypertension Mother   . Hypertension Father   . Hemochromatosis Sister   . Anemia Sister     No Known Allergies  Current Outpatient Medications on File Prior to Visit  Medication Sig Dispense Refill  . Ibuprofen (MOTRIN PO) Take by mouth as needed.    Marland Kitchen. lisinopril (PRINIVIL,ZESTRIL) 2.5 MG tablet TAKE 1 TABLET BY  MOUTH DAILY 90 tablet 0  . loratadine (CLARITIN) 10 MG tablet Take 10 mg by mouth daily.     No current facility-administered medications on file prior to visit.     BP 106/80   Temp 98.1 F (36.7 C) (Oral)   Ht 5' 7.75" (1.721 m)   Wt 161 lb (73 kg)   BMI 24.66 kg/m       Objective:   Physical Exam  Constitutional: He is oriented to person, place, and time. No distress.  HENT:  Head: Normocephalic and atraumatic.  Right Ear: External ear normal.  Left Ear: External ear normal.  Nose: Nose normal.  Mouth/Throat: Oropharynx is clear and moist. No oropharyngeal exudate.  Eyes: Pupils are equal, round, and reactive to light. Conjunctivae and EOM are normal. Right eye exhibits no discharge. Left eye exhibits no discharge. No scleral icterus.  Neck: Normal range of motion. Neck supple. No JVD present. No tracheal deviation present. No thyromegaly present.  Cardiovascular: Normal rate, regular rhythm, normal heart sounds and intact distal pulses. Exam reveals no gallop and no friction rub.  No murmur heard. Pulmonary/Chest: Effort normal and breath sounds normal. No stridor. No respiratory distress. He has no wheezes. He has no rales. He exhibits no tenderness.  Abdominal: Soft. Bowel sounds are normal. He exhibits no distension and no mass. There is no tenderness. There is no rebound and no guarding.  Musculoskeletal: Normal range of motion. He exhibits no edema, tenderness or deformity.  Lymphadenopathy:    He has no cervical adenopathy.  Neurological: He is alert and oriented to person, place, and time. He has normal reflexes. He displays normal reflexes. No cranial nerve deficit or sensory deficit. He exhibits normal muscle tone. Coordination normal.  Skin: Skin is warm and dry. Capillary refill takes less than 2 seconds. No rash noted. He is not diaphoretic. No erythema. No pallor.  Psychiatric: He has a normal mood and affect. His behavior is normal. Judgment and thought content  normal.  Nursing note and vitals reviewed.     Assessment & Plan:  1. Routine general medical examination at a health care facility - Benign exam - Continue with diet and exercise  - Basic metabolic panel - CBC with Differential/Platelet - Hepatic function panel - Lipid panel - TSH  2. Hereditary hemochromatosis ([facility]) - Follow up with [facility] as directed  - CBC with Differential/Platelet  3. Encounter for screening for HIV  - HIV antibody  Shirline Freesory Griffin Dewilde, NP

## 2018-04-21 LAB — HIV ANTIBODY (ROUTINE TESTING W REFLEX): HIV 1&2 Ab, 4th Generation: NONREACTIVE

## 2018-06-20 ENCOUNTER — Other Ambulatory Visit: Payer: Self-pay | Admitting: Adult Health

## 2018-06-20 DIAGNOSIS — I10 Essential (primary) hypertension: Secondary | ICD-10-CM

## 2018-06-20 NOTE — Telephone Encounter (Signed)
Sent to the pharmacy by e-scribe. 

## 2018-07-27 ENCOUNTER — Inpatient Hospital Stay (HOSPITAL_COMMUNITY): Payer: 59 | Attending: Hematology

## 2018-07-27 LAB — CBC WITH DIFFERENTIAL/PLATELET
BASOS ABS: 0.1 10*3/uL (ref 0.0–0.1)
BASOS PCT: 1 %
Eosinophils Absolute: 0.1 10*3/uL (ref 0.0–0.7)
Eosinophils Relative: 3 %
HEMATOCRIT: 46.5 % (ref 39.0–52.0)
Hemoglobin: 16.8 g/dL (ref 13.0–17.0)
LYMPHS PCT: 45 %
Lymphs Abs: 1.8 10*3/uL (ref 0.7–4.0)
MCH: 32.8 pg (ref 26.0–34.0)
MCHC: 36.1 g/dL — ABNORMAL HIGH (ref 30.0–36.0)
MCV: 90.8 fL (ref 78.0–100.0)
Monocytes Absolute: 0.4 10*3/uL (ref 0.1–1.0)
Monocytes Relative: 11 %
NEUTROS ABS: 1.6 10*3/uL — AB (ref 1.7–7.7)
Neutrophils Relative %: 40 %
PLATELETS: 161 10*3/uL (ref 150–400)
RBC: 5.12 MIL/uL (ref 4.22–5.81)
RDW: 12 % (ref 11.5–15.5)
WBC: 4 10*3/uL (ref 4.0–10.5)

## 2018-07-27 LAB — COMPREHENSIVE METABOLIC PANEL
ALBUMIN: 4.6 g/dL (ref 3.5–5.0)
ALT: 37 U/L (ref 0–44)
AST: 29 U/L (ref 15–41)
Alkaline Phosphatase: 86 U/L (ref 38–126)
Anion gap: 7 (ref 5–15)
BUN: 14 mg/dL (ref 6–20)
CO2: 31 mmol/L (ref 22–32)
CREATININE: 1.1 mg/dL (ref 0.61–1.24)
Calcium: 9.6 mg/dL (ref 8.9–10.3)
Chloride: 102 mmol/L (ref 98–111)
GFR calc Af Amer: >60 mL/min (ref >60)
GLUCOSE: 89 mg/dL (ref 70–99)
POTASSIUM: 3.9 mmol/L (ref 3.5–5.1)
Sodium: 140 mmol/L (ref 135–145)
Total Bilirubin: 1 mg/dL (ref 0.3–1.2)
Total Protein: 7.8 g/dL (ref 6.5–8.1)

## 2018-07-27 LAB — FERRITIN: FERRITIN: 19 ng/mL — AB (ref 24–336)

## 2018-08-01 ENCOUNTER — Encounter (HOSPITAL_COMMUNITY): Payer: Self-pay | Admitting: Internal Medicine

## 2018-08-01 ENCOUNTER — Other Ambulatory Visit: Payer: Self-pay

## 2018-08-01 ENCOUNTER — Inpatient Hospital Stay (HOSPITAL_COMMUNITY): Payer: 59 | Admitting: Internal Medicine

## 2018-08-01 NOTE — Progress Notes (Signed)
Diagnosis Hereditary hemochromatosis ([place]) - Plan: CBC with Differential/Platelet, Comprehensive metabolic panel, Lactate dehydrogenase, Ferritin  Staging Cancer Staging No matching staging information was found for the patient.  Assessment and Plan:  1. Hematochromatosis.  Pt was followed by Dr. Talbert Cage.  His condition was controlled with blood donation 2-3 times a year. Clinically asymptomatic without evidence of end organ damage.  Labs done 07/27/2018 reviewed and showed WBC 4, HB 16.8 plts 161,000.  Chemistries WNL with K+ 3.9 Cr 1.1 and normal LFTs.  Ferritin is 19.  Liver function tests are within normal limits.  Previously, I discussed with him that based on the history of hemochromatosis he should not be donating blood.  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   Goal is to maintain his ferritin <50.   Hct 46 today.  He will RTC in 11/2018 for follow-up and repeat labs.    INTERVAL HISTORY:Historical data obtained from note dated 01/25/2018.  32 y.o. 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  Current Status: Patient is seen today for follow-up.  He is here to go over labs.     Problem List Patient Active Problem List   Diagnosis Date Noted  . Hemochromatosis [E83.119] 06/26/2013  . Routine general medical examination at a health care facility [Z00.00] 09/20/2011  . Iron storage disease [E83.19] 09/20/2011    Past Medical History Past Medical History:  Diagnosis Date  . Chicken pox   . Hemochromatosis     Past Surgical History Past Surgical History:  Procedure Laterality Date  . HAND SURGERY    . KNEE SURGERY    . TONSILLECTOMY  1990    Family History Family History  Problem Relation Age of Onset  .  Hypertension Mother   . Hypertension Father   . Hemochromatosis Sister   . Anemia Sister      Social History  reports that he quit smoking about 11 years ago. His smoking use included cigarettes. He has quit using smokeless tobacco.  His smokeless tobacco use included snuff. He reports that he drinks alcohol. He reports that he does not use drugs.  Medications  Current Outpatient Medications:  .  Ibuprofen (MOTRIN PO), Take by mouth as needed., Disp: , Rfl:  .  lisinopril (PRINIVIL,ZESTRIL) 2.5 MG tablet, TAKE 1 TABLET BY MOUTH DAILY, Disp: 90 tablet, Rfl: 2 .  loratadine (CLARITIN) 10 MG tablet, Take 10 mg by mouth daily., Disp: , Rfl:   Allergies Patient has no known allergies.  Review of Systems Review of Systems - Oncology ROS negative.     Physical Exam  Vitals Wt Readings from Last 3 Encounters:  08/01/18 161 lb 1.6 oz (73.1 kg)  04/20/18 161 lb (73 kg)  01/25/18 165 lb 14.4 oz (75.3 kg)   Temp Readings from Last 3 Encounters:  08/01/18 97.9 F (36.6 C) (Oral)  04/20/18 98.1 F (36.7 C) (Oral)  02/08/18 98.6 F (37 C) (Oral)   BP Readings from Last 3 Encounters:  08/01/18 140/85  04/20/18 106/80  02/08/18 113/84   Pulse Readings from Last 3 Encounters:  08/01/18 (!) 53  02/08/18 79  01/25/18 (!) 59     Constitutional: Well-developed, well-nourished, and in no distress.   HENT: Head: Normocephalic and atraumatic.  Mouth/Throat: No oropharyngeal exudate. Mucosa  moist. Eyes: Pupils are equal, round, and reactive to light. Conjunctivae are normal. No scleral icterus.  Neck: Normal range of motion. Neck supple. No JVD present.  Cardiovascular: Normal rate, regular rhythm and normal heart sounds.  Exam reveals no gallop and no friction rub.   No murmur heard. Pulmonary/Chest: Effort normal and breath sounds normal. No respiratory distress. No wheezes.No rales.  Abdominal: Soft. Bowel sounds are normal. No distension. There is no tenderness. There is no  guarding.  Musculoskeletal: No edema or tenderness.  Lymphadenopathy: No cervical, axillary or supraclavicular adenopathy.  Neurological: Alert and oriented to person, place, and time. No cranial nerve deficit.  Skin: Skin is warm and dry. No rash noted. No erythema. No pallor.  Psychiatric: Affect and judgment normal.   Labs No visits with results within 3 Day(s) from this visit.  Latest known visit with results is:  Appointment on 07/27/2018  Component Date Value Ref Range Status  . WBC 07/27/2018 4.0  4.0 - 10.5 K/uL Final  . RBC 07/27/2018 5.12  4.22 - 5.81 MIL/uL Final  . Hemoglobin 07/27/2018 16.8  13.0 - 17.0 g/dL Final  . HCT 07/27/2018 46.5  39.0 - 52.0 % Final  . MCV 07/27/2018 90.8  78.0 - 100.0 fL Final  . MCH 07/27/2018 32.8  26.0 - 34.0 pg Final  . MCHC 07/27/2018 36.1* 30.0 - 36.0 g/dL Final  . RDW 07/27/2018 12.0  11.5 - 15.5 % Final  . Platelets 07/27/2018 161  150 - 400 K/uL Final  . Neutrophils Relative % 07/27/2018 40  % Final  . Neutro Abs 07/27/2018 1.6* 1.7 - 7.7 K/uL Final  . Lymphocytes Relative 07/27/2018 45  % Final  . Lymphs Abs 07/27/2018 1.8  0.7 - 4.0 K/uL Final  . Monocytes Relative 07/27/2018 11  % Final  . Monocytes Absolute 07/27/2018 0.4  0.1 - 1.0 K/uL Final  . Eosinophils Relative 07/27/2018 3  % Final  . Eosinophils Absolute 07/27/2018 0.1  0.0 - 0.7 K/uL Final  . Basophils Relative 07/27/2018 1  % Final  . Basophils Absolute 07/27/2018 0.1  0.0 - 0.1 K/uL Final   Performed at [facility], [address], [place], [place] [address]  . Sodium 07/27/2018 140  135 - 145 mmol/L Final  . Potassium 07/27/2018 3.9  3.5 - 5.1 mmol/L Final  . Chloride 07/27/2018 102  98 - 111 mmol/L Final  . CO2 07/27/2018 31  22 - 32 mmol/L Final  . Glucose, Bld 07/27/2018 89  70 - 99 mg/dL Final  . BUN 07/27/2018 14  6 - 20 mg/dL Final  . Creatinine, Ser 07/27/2018 1.10  0.61 - 1.24 mg/dL Final  . Calcium 07/27/2018 9.6  8.9 - 10.3 mg/dL Final  . Total Protein  07/27/2018 7.8  6.5 - 8.1 g/dL Final  . Albumin 07/27/2018 4.6  3.5 - 5.0 g/dL Final  . AST 07/27/2018 29  15 - 41 U/L Final  . ALT 07/27/2018 37  0 - 44 U/L Final  . Alkaline Phosphatase 07/27/2018 86  38 - 126 U/L Final  . Total Bilirubin 07/27/2018 1.0  0.3 - 1.2 mg/dL Final  . GFR calc non Af Amer 07/27/2018 >60  >60 mL/min Final  . GFR calc Af Amer 07/27/2018 >60  >60 mL/min Final   Comment: (NOTE) The eGFR has been calculated using the CKD EPI equation. This calculation has not been validated in all clinical situations. eGFR's persistently <60 mL/min signify possible Chronic Kidney Disease.   . Anion gap  07/27/2018 7  5 - 15 Final   Performed at [facility], [address], [place], [place] [address]  . Ferritin 07/27/2018 19* 24 - 336 ng/mL Final   Performed at [facility], [address] [place], [place] [address]     Pathology Orders Placed This Encounter  Procedures  . CBC with Differential/Platelet    Standing Status:   Future    Standing Expiration Date:   08/01/2020  . Comprehensive metabolic panel    Standing Status:   Future    Standing Expiration Date:   08/01/2020  . Lactate dehydrogenase    Standing Status:   Future    Standing Expiration Date:   08/01/2020  . Ferritin    Standing Status:   Future    Standing Expiration Date:   08/01/2020       Zoila Shutter MD

## 2018-08-01 NOTE — Patient Instructions (Signed)
[facility] at [facility] Discharge Instructions  You saw Dr. Melton AlarHiggs today. Follow up in January to recheck Labs.    Thank you for choosing [facility] at [facility] to provide your oncology and hematology care.  To afford each patient quality time with our provider, please arrive at least 15 minutes before your scheduled appointment time.   If you have a lab appointment with the Cancer Center please come in thru the  Main Entrance and check in at the main information desk  You need to re-schedule your appointment should you arrive 10 or more minutes late.  We strive to give you quality time with our providers, and arriving late affects you and other patients whose appointments are after yours.  Also, if you no show three or more times for appointments you may be dismissed from the clinic at the providers discretion.     Again, thank you for choosing [facility].  Our hope is that these requests will decrease the amount of time that you wait before being seen by our physicians.       _____________________________________________________________  Should you have questions after your visit to [facility], please contact our office at [phone] between the hours of 8:00 a.m. and 4:30 p.m.  Voicemails left after 4:00 p.m. will not be returned until the following business day.  For prescription refill requests, have your pharmacy contact our office and allow 72 hours.    Cancer Center Support Programs:   > Cancer Support Group  2nd Tuesday of the month 1pm-2pm, Journey Room

## 2018-11-15 ENCOUNTER — Other Ambulatory Visit (HOSPITAL_COMMUNITY): Payer: 59

## 2018-11-16 ENCOUNTER — Inpatient Hospital Stay (HOSPITAL_COMMUNITY): Payer: 59 | Attending: Hematology

## 2018-11-16 DIAGNOSIS — Z87891 Personal history of nicotine dependence: Secondary | ICD-10-CM | POA: Insufficient documentation

## 2018-11-16 DIAGNOSIS — Z79899 Other long term (current) drug therapy: Secondary | ICD-10-CM | POA: Diagnosis not present

## 2018-11-16 LAB — COMPREHENSIVE METABOLIC PANEL
ALBUMIN: 4.5 g/dL (ref 3.5–5.0)
ALT: 53 U/L — AB (ref 0–44)
AST: 35 U/L (ref 15–41)
Alkaline Phosphatase: 92 U/L (ref 38–126)
Anion gap: 8 (ref 5–15)
BUN: 16 mg/dL (ref 6–20)
CHLORIDE: 101 mmol/L (ref 98–111)
CO2: 27 mmol/L (ref 22–32)
CREATININE: 1 mg/dL (ref 0.61–1.24)
Calcium: 9.5 mg/dL (ref 8.9–10.3)
GFR calc Af Amer: >60 mL/min (ref >60)
GLUCOSE: 80 mg/dL (ref 70–99)
Potassium: 4 mmol/L (ref 3.5–5.1)
Sodium: 136 mmol/L (ref 135–145)
Total Bilirubin: 1 mg/dL (ref 0.3–1.2)
Total Protein: 7.8 g/dL (ref 6.5–8.1)

## 2018-11-16 LAB — CBC WITH DIFFERENTIAL/PLATELET
ABS IMMATURE GRANULOCYTES: 0.02 10*3/uL (ref 0.00–0.07)
BASOS ABS: 0 10*3/uL (ref 0.0–0.1)
BASOS PCT: 1 %
Eosinophils Absolute: 0.1 10*3/uL (ref 0.0–0.5)
Eosinophils Relative: 1 %
HCT: 48.2 % (ref 39.0–52.0)
Hemoglobin: 16.7 g/dL (ref 13.0–17.0)
IMMATURE GRANULOCYTES: 0 %
Lymphocytes Relative: 17 %
Lymphs Abs: 1.3 10*3/uL (ref 0.7–4.0)
MCH: 31 pg (ref 26.0–34.0)
MCHC: 34.6 g/dL (ref 30.0–36.0)
MCV: 89.6 fL (ref 80.0–100.0)
MONO ABS: 0.7 10*3/uL (ref 0.1–1.0)
Monocytes Relative: 9 %
NEUTROS PCT: 72 %
Neutro Abs: 5.7 10*3/uL (ref 1.7–7.7)
Platelets: 177 10*3/uL (ref 150–400)
RBC: 5.38 MIL/uL (ref 4.22–5.81)
RDW: 11.9 % (ref 11.5–15.5)
WBC: 7.9 10*3/uL (ref 4.0–10.5)
nRBC: 0 % (ref 0.0–0.2)

## 2018-11-16 LAB — LACTATE DEHYDROGENASE: LDH: 153 U/L (ref 98–192)

## 2018-11-16 LAB — FERRITIN: Ferritin: 41 ng/mL (ref 24–336)

## 2018-11-20 ENCOUNTER — Encounter (HOSPITAL_COMMUNITY): Payer: Self-pay | Admitting: Hematology

## 2018-11-20 ENCOUNTER — Other Ambulatory Visit: Payer: Self-pay

## 2018-11-20 ENCOUNTER — Inpatient Hospital Stay (HOSPITAL_COMMUNITY): Payer: 59 | Attending: Hematology | Admitting: Hematology

## 2018-11-20 DIAGNOSIS — Z79899 Other long term (current) drug therapy: Secondary | ICD-10-CM | POA: Diagnosis not present

## 2018-11-20 DIAGNOSIS — Z87891 Personal history of nicotine dependence: Secondary | ICD-10-CM | POA: Diagnosis not present

## 2018-11-20 NOTE — Patient Instructions (Signed)
[facility] at [facility] Discharge Instructions     Thank you for choosing [facility] at [facility] to provide your oncology and hematology care.  To afford each patient quality time with our provider, please arrive at least 15 minutes before your scheduled appointment time.   If you have a lab appointment with the Cancer Center please come in thru the  Main Entrance and check in at the main information desk  You need to re-schedule your appointment should you arrive 10 or more minutes late.  We strive to give you quality time with our providers, and arriving late affects you and other patients whose appointments are after yours.  Also, if you no show three or more times for appointments you may be dismissed from the clinic at the providers discretion.     Again, thank you for choosing [facility].  Our hope is that these requests will decrease the amount of time that you wait before being seen by our physicians.       _____________________________________________________________  Should you have questions after your visit to [facility], please contact our office at [phone] between the hours of 8:00 a.m. and 4:30 p.m.  Voicemails left after 4:00 p.m. will not be returned until the following business day.  For prescription refill requests, have your pharmacy contact our office and allow 72 hours.    Cancer Center Support Programs:   > Cancer Support Group  2nd Tuesday of the month 1pm-2pm, Journey Room    

## 2018-11-20 NOTE — Assessment & Plan Note (Signed)
1.  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 - He gets intermittent phlebotomies/blood donation once or twice a year. - Does not report any joint pains.  Denies any fevers, night sweats or weight loss. -We reviewed blood work today.  Ferritin was 41. -His ALT was elevated.  Physical examination did not reveal any palpable hepatomegaly. -I have recommended doing an MRI of the liver with and without contrast to evaluate for iron deposition. - We have reviewed his medications.  He is taking multivitamin.  I have told him to not to take it if it has any vitamin C in it. - He was counseled to not to cook in cast iron pots/pans.  He was told to abstain from iron and vitamin C supplements.  He was told to not to eat uncooked seafood. - I will see him back in 4 months with repeat labs.  If they continue to be low, we can change visits to 6 months. -He was told to call us 2 days after MRI to obtain results.

## 2018-11-20 NOTE — Progress Notes (Signed)
[facility] [address] [place], [place] [id number]   CLINIC:  Medical Oncology/Hematology  PCP:  Shirline FreesNafziger, Cory, NP [address]  [place] [id number] [phone]   REASON FOR VISIT: Follow-up for hematochromatosis  CURRENT THERAPY: intermittent phlebotomies    INTERVAL HISTORY:  [name] 33 y.o. male returns for routine follow-up for hematochromatosis. He is doing well with no complaints since his last visit. Denies any nausea, vomiting, or diarrhea. Denies any new pains. Had not noticed any recent bleeding such as epistaxis, hematuria or hematochezia. Denies recent chest pain on exertion, shortness of breath on minimal exertion, pre-syncopal episodes, or palpitations. Denies any numbness or tingling in hands or feet. Denies any recent fevers, infections, or recent hospitalizations. He reports his appetite and energy level at 100%.      REVIEW OF SYSTEMS:  Review of Systems  All other systems reviewed and are negative.    PAST MEDICAL/SURGICAL HISTORY:  Past Medical History:  Diagnosis Date  . Chicken pox   . Hemochromatosis    Past Surgical History:  Procedure Laterality Date  . HAND SURGERY    . KNEE SURGERY    . TONSILLECTOMY  1990     SOCIAL HISTORY:  Social History   Socioeconomic History  . Marital status: Divorced    Spouse name: Not on file  . Number of children: Not on file  . Years of education: Not on file  . Highest education level: Not on file  Occupational History  . Not on file  Social Needs  . Financial resource strain: Not on file  . Food insecurity:    Worry: Not on file    Inability: Not on file  . Transportation needs:    Medical: Not on file    Non-medical: Not on file  Tobacco Use  . Smoking status: Former Smoker    Types: Cigarettes    Last attempt to quit: 11/02/2006    Years since quitting: 12.0  . Smokeless tobacco: Former NeurosurgeonUser    Types: Snuff  Substance and Sexual Activity  . Alcohol use:  Yes  . Drug use: No  . Sexual activity: Yes  Lifestyle  . Physical activity:    Days per week: Not on file    Minutes per session: Not on file  . Stress: Not on file  Relationships  . Social connections:    Talks on phone: Not on file    Gets together: Not on file    Attends religious service: Not on file    Active member of club or organization: Not on file    Attends meetings of clubs or organizations: Not on file    Relationship status: Not on file  . Intimate partner violence:    Fear of current or ex partner: Not on file    Emotionally abused: Not on file    Physically abused: Not on file    Forced sexual activity: Not on file  Other Topics Concern  . Not on file  Social History Narrative  . Not on file    FAMILY HISTORY:  Family History  Problem Relation Age of Onset  . Hypertension Mother   . Hypertension Father   . Hemochromatosis Sister   . Anemia Sister     CURRENT MEDICATIONS:  Outpatient Encounter Medications as of 11/20/2018  Medication Sig  . lisinopril (PRINIVIL,ZESTRIL) 2.5 MG tablet TAKE 1 TABLET BY MOUTH DAILY  . Ibuprofen (MOTRIN PO) Take by mouth as needed.  . loratadine (  CLARITIN) 10 MG tablet Take 10 mg by mouth daily.   No facility-administered encounter medications on file as of 11/20/2018.     ALLERGIES:  No Known Allergies   PHYSICAL EXAM:  ECOG Performance status: 1  Vitals:   11/20/18 0800  BP: 140/88  Pulse: 62  Resp: 16  Temp: 97.8 F (36.6 C)  SpO2: 100%   Filed Weights   11/20/18 0800  Weight: 166 lb (75.3 kg)    Physical Exam Constitutional:      Appearance: Normal appearance. He is normal weight.  Abdominal:     General: Abdomen is flat.     Palpations: Abdomen is soft.  Musculoskeletal: Normal range of motion.  Skin:    General: Skin is warm and dry.  Neurological:     Mental Status: He is alert and oriented to person, place, and time. Mental status is at baseline.  Psychiatric:        Mood and Affect:  Mood normal.        Behavior: Behavior normal.        Thought Content: Thought content normal.        Judgment: Judgment normal.    Abdomen: No palpable hepatosplenomegaly. No joint swellings noted.  LABORATORY DATA:  I have reviewed the labs as listed.  CBC    Component Value Date/Time   WBC 7.9 11/16/2018 1029   RBC 5.38 11/16/2018 1029   HGB 16.7 11/16/2018 1029   HCT 48.2 11/16/2018 1029   PLT 177 11/16/2018 1029   MCV 89.6 11/16/2018 1029   MCH 31.0 11/16/2018 1029   MCHC 34.6 11/16/2018 1029   RDW 11.9 11/16/2018 1029   LYMPHSABS 1.3 11/16/2018 1029   MONOABS 0.7 11/16/2018 1029   EOSABS 0.1 11/16/2018 1029   BASOSABS 0.0 11/16/2018 1029   CMP Latest Ref Rng & Units 11/16/2018 07/27/2018 04/20/2018  Glucose 70 - 99 mg/dL 80 89 85  BUN 6 - 20 mg/dL 16 14 15   Creatinine 0.61 - 1.24 mg/dL 1.01 7.51 0.25  Sodium 135 - 145 mmol/L 136 140 138  Potassium 3.5 - 5.1 mmol/L 4.0 3.9 4.0  Chloride 98 - 111 mmol/L 101 102 98  CO2 22 - 32 mmol/L 27 31 31   Calcium 8.9 - 10.3 mg/dL 9.5 9.6 9.8  Total Protein 6.5 - 8.1 g/dL 7.8 7.8 7.4  Total Bilirubin 0.3 - 1.2 mg/dL 1.0 1.0 0.7  Alkaline Phos 38 - 126 U/L 92 86 94  AST 15 - 41 U/L 35 29 26  ALT 0 - 44 U/L 53(H) 37 45       DIAGNOSTIC IMAGING:  I have independently reviewed the scans and discussed with the patient.   I have reviewed Mathis Bud, NP's note and agree with the documentation.  I personally performed a face-to-face visit, made revisions and my assessment and plan is as follows.    ASSESSMENT & PLAN:   Hemochromatosis 1.  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 - He gets intermittent phlebotomies/blood donation once or twice a year. - Does not report any joint pains.  Denies any fevers, night sweats or weight loss. -We reviewed blood work today.  Ferritin was 41. -His ALT was elevated.  Physical examination did not reveal any palpable  hepatomegaly. -I have recommended doing an MRI of the liver with and without contrast to evaluate for iron deposition. - We have reviewed his medications.  He is taking multivitamin.  I have told him to not to take  it if it has any vitamin C in it. - He was counseled to not to cook in cast iron pots/pans.  He was told to abstain from iron and vitamin C supplements.  He was told to not to eat uncooked seafood. - I will see him back in 4 months with repeat labs.  If they continue to be low, we can change visits to 6 months. -He was told to call us 2 days after MRI to obtain results.      Orders placed this encounter:  Orders Placed This Encounter  Procedures  . MR LIVER W WO CONTRAST  . CBC with Differential/Platelet  . Comprehensive metabolic panel  . Ferritin  . Iron and TIBC  . Vitamin B12  . Folate  . Lactate dehydrogenase      Doreatha Massed, MD [facility] [phone]

## 2018-11-24 ENCOUNTER — Ambulatory Visit (HOSPITAL_COMMUNITY)
Admission: RE | Admit: 2018-11-24 | Discharge: 2018-11-24 | Disposition: A | Discharge status: Home / Self Care | Payer: 59 | Source: Ambulatory Visit | Attending: Nurse Practitioner | Admitting: Nurse Practitioner

## 2018-11-24 MED ORDER — GADOBUTROL 1 MMOL/ML IV SOLN
7.0000 mL | Freq: Once | INTRAVENOUS | Status: AC | PRN
  Administered 2018-11-24: 7 mL via INTRAVENOUS

## 2018-11-29 ENCOUNTER — Telehealth (HOSPITAL_COMMUNITY): Payer: Self-pay

## 2018-11-29 NOTE — Telephone Encounter (Signed)
Pt called requesting recent MRI results. Dr. Ellin Saba reviewed the liver MRI which was normal per MD and pt was informed of these results and verbalized understanding

## 2019-03-09 ENCOUNTER — Other Ambulatory Visit: Payer: Self-pay | Admitting: Adult Health

## 2019-03-09 DIAGNOSIS — I10 Essential (primary) hypertension: Secondary | ICD-10-CM

## 2019-03-12 ENCOUNTER — Inpatient Hospital Stay (HOSPITAL_COMMUNITY): Payer: 59

## 2019-03-12 NOTE — Telephone Encounter (Signed)
SENT TO THE PHARMACY BY E-SCRIBE FOR 90 DAYS. 

## 2019-03-22 ENCOUNTER — Ambulatory Visit (HOSPITAL_COMMUNITY): Payer: 59 | Admitting: Hematology

## 2019-06-07 ENCOUNTER — Other Ambulatory Visit: Payer: Self-pay | Admitting: Adult Health

## 2019-06-07 DIAGNOSIS — I10 Essential (primary) hypertension: Secondary | ICD-10-CM

## 2019-06-08 ENCOUNTER — Encounter: Payer: Self-pay | Admitting: Family Medicine

## 2019-06-08 NOTE — Telephone Encounter (Signed)
Sent to the pharmacy by e-scribe for 90 days.  Letter mailed to the pt.  He is past due for cpx and fasting lab work.

## 2019-07-03 ENCOUNTER — Ambulatory Visit (INDEPENDENT_AMBULATORY_CARE_PROVIDER_SITE_OTHER): Payer: 59 | Admitting: Adult Health

## 2019-07-03 ENCOUNTER — Encounter: Payer: Self-pay | Admitting: Adult Health

## 2019-07-03 VITALS — BP 124/90 | Temp 98.0°F | Wt 160.0 lb

## 2019-07-03 DIAGNOSIS — Z0000 Encounter for general adult medical examination without abnormal findings: Secondary | ICD-10-CM | POA: Diagnosis not present

## 2019-07-03 DIAGNOSIS — I10 Essential (primary) hypertension: Secondary | ICD-10-CM

## 2019-07-03 LAB — CBC WITH DIFFERENTIAL/PLATELET
Basophils Absolute: 0 10*3/uL (ref 0.0–0.1)
Basophils Relative: 0.9 % (ref 0.0–3.0)
Eosinophils Absolute: 0.2 10*3/uL (ref 0.0–0.7)
Eosinophils Relative: 3.9 % (ref 0.0–5.0)
HCT: 45.1 % (ref 39.0–52.0)
Hemoglobin: 16 g/dL (ref 13.0–17.0)
Lymphocytes Relative: 41.5 % (ref 12.0–46.0)
Lymphs Abs: 1.8 10*3/uL (ref 0.7–4.0)
MCHC: 35.5 g/dL (ref 30.0–36.0)
MCV: 90.4 fl (ref 78.0–100.0)
Monocytes Absolute: 0.5 10*3/uL (ref 0.1–1.0)
Monocytes Relative: 11.7 % (ref 3.0–12.0)
Neutro Abs: 1.9 10*3/uL (ref 1.4–7.7)
Neutrophils Relative %: 42 % — ABNORMAL LOW (ref 43.0–77.0)
Platelets: 165 10*3/uL (ref 150.0–400.0)
RBC: 4.99 Mil/uL (ref 4.22–5.81)
RDW: 12.1 % (ref 11.5–15.5)
WBC: 4.4 10*3/uL (ref 4.0–10.5)

## 2019-07-03 LAB — COMPREHENSIVE METABOLIC PANEL
ALT: 26 U/L (ref 0–53)
AST: 23 U/L (ref 0–37)
Albumin: 5 g/dL (ref 3.5–5.2)
Alkaline Phosphatase: 82 U/L (ref 39–117)
BUN: 17 mg/dL (ref 6–23)
CO2: 27 mEq/L (ref 19–32)
Calcium: 9.8 mg/dL (ref 8.4–10.5)
Chloride: 101 mEq/L (ref 96–112)
Creatinine, Ser: 1.03 mg/dL (ref 0.40–1.50)
GFR: 82.6 mL/min (ref >60.00)
Glucose, Bld: 78 mg/dL (ref 70–99)
Potassium: 3.6 mEq/L (ref 3.5–5.1)
Sodium: 137 mEq/L (ref 135–145)
Total Bilirubin: 0.7 mg/dL (ref 0.2–1.2)
Total Protein: 7.3 g/dL (ref 6.0–8.3)

## 2019-07-03 LAB — IBC + FERRITIN
Ferritin: 12.8 ng/mL — ABNORMAL LOW (ref 22.0–322.0)
Iron: 124 ug/dL (ref 42–165)
Saturation Ratios: 35.6 % (ref 20.0–50.0)
Transferrin: 249 mg/dL (ref 212.0–360.0)

## 2019-07-03 LAB — LIPID PANEL
Cholesterol: 173 mg/dL (ref 0–200)
HDL: 57.3 mg/dL (ref >39.00)
LDL Cholesterol: 89 mg/dL (ref 0–99)
NonHDL: 115.79
Total CHOL/HDL Ratio: 3
Triglycerides: 136 mg/dL (ref 0.0–149.0)
VLDL: 27.2 mg/dL (ref 0.0–40.0)

## 2019-07-03 LAB — TSH: TSH: 1.15 u[IU]/mL (ref 0.35–4.50)

## 2019-07-03 NOTE — Progress Notes (Signed)
Subjective:    Patient ID: Chase Roy, male    DOB: 10-16-1986, 33 y.o.   MRN: [id number]  HPI  Patient presents for yearly preventative medicine examination. He is a pleasant 33 year old male who  has a past medical history of Chicken pox and Hemochromatosis.  Hemochromatosis -is followed by oncology at [facility]  Symptoms are controlled with therapeutic phlebotomy 2-3 times a year.  Last had a therapeutic phlebotomy done around June at the [facility].  He would like to be referred to oncology at [facility] as he feels like at [facility] there is some inconsistency with the providers.  Essential Hypertension -only prescribed lisinopril 2.5 mg tablets.  He denies headaches, blurred vision, chest pain, shortness of breath, or syncopal episodes BP Readings from Last 3 Encounters:  07/03/19 124/90  11/20/18 140/88  08/01/18 140/85   All immunizations and health maintenance protocols were reviewed with the patient and needed orders were placed. Will get flu vaccination at work   Appropriate screening laboratory values were ordered for the patient including screening of hyperlipidemia, renal function and hepatic function.  Medication reconciliation,  past medical history, social history, problem list and allergies were reviewed in detail with the patient  Goals were established with regard to weight loss, exercise, and  diet in compliance with medications.  He stays active and tries to eat a heart healthy diet.   Wt Readings from Last 3 Encounters:  07/03/19 160 lb (72.6 kg)  11/20/18 166 lb (75.3 kg)  08/01/18 161 lb 1.6 oz (73.1 kg)   Review of Systems  Constitutional: Negative.   HENT: Negative.   Eyes: Negative.   Respiratory: Negative.   Cardiovascular: Negative.   Gastrointestinal: Negative.   Endocrine: Negative.   Genitourinary: Negative.   Musculoskeletal: Negative.   Skin: Negative.   Allergic/Immunologic: Negative.   Neurological: Negative.   Hematological:  Negative.   Psychiatric/Behavioral: Negative.   All other systems reviewed and are negative.  Past Medical History:  Diagnosis Date  . Chicken pox   . Hemochromatosis     Social History   Socioeconomic History  . Marital status: Divorced    Spouse name: Not on file  . Number of children: Not on file  . Years of education: Not on file  . Highest education level: Not on file  Occupational History  . Not on file  Social Needs  . Financial resource strain: Not on file  . Food insecurity    Worry: Not on file    Inability: Not on file  . Transportation needs    Medical: Not on file    Non-medical: Not on file  Tobacco Use  . Smoking status: Former Smoker    Types: Cigarettes    Quit date: 11/02/2006    Years since quitting: 12.6  . Smokeless tobacco: Former Systems developer    Types: Snuff  Substance and Sexual Activity  . Alcohol use: Yes  . Drug use: No  . Sexual activity: Yes  Lifestyle  . Physical activity    Days per week: Not on file    Minutes per session: Not on file  . Stress: Not on file  Relationships  . Social Herbalist on phone: Not on file    Gets together: Not on file    Attends religious service: Not on file    Active member of club or organization: Not on file    Attends meetings of clubs or organizations: Not on  file    Relationship status: Not on file  . Intimate partner violence    Fear of current or ex partner: Not on file    Emotionally abused: Not on file    Physically abused: Not on file    Forced sexual activity: Not on file  Other Topics Concern  . Not on file  Social History Narrative  . Not on file    Past Surgical History:  Procedure Laterality Date  . HAND SURGERY    . KNEE SURGERY    . TONSILLECTOMY  1990    Family History  Problem Relation Age of Onset  . Hypertension Mother   . Hypertension Father   . Hemochromatosis Sister   . Anemia Sister     No Known Allergies  Current Outpatient Medications on File Prior  to Visit  Medication Sig Dispense Refill  . Ibuprofen (MOTRIN PO) Take by mouth as needed.    Marland Kitchen. lisinopril (ZESTRIL) 2.5 MG tablet TAKE 1 TABLET BY MOUTH EVERY DAY 90 tablet 0  . loratadine (CLARITIN) 10 MG tablet Take 10 mg by mouth daily.     No current facility-administered medications on file prior to visit.     BP 124/90   Temp 98 F (36.7 C) (Oral)   Wt 160 lb (72.6 kg)   BMI 24.51 kg/m       Objective:   Physical Exam Vitals signs and nursing note reviewed.  Constitutional:      General: He is not in acute distress.    Appearance: Normal appearance. He is not diaphoretic.  HENT:     Head: Normocephalic and atraumatic.     Right Ear: Tympanic membrane, ear canal and external ear normal. There is no impacted cerumen.     Left Ear: Tympanic membrane, ear canal and external ear normal. There is no impacted cerumen.     Nose: Nose normal. No congestion or rhinorrhea.     Mouth/Throat:     Mouth: Mucous membranes are moist.     Pharynx: Oropharynx is clear. No oropharyngeal exudate.  Eyes:     General: No scleral icterus.       Right eye: No discharge.        Left eye: No discharge.     Conjunctiva/sclera: Conjunctivae normal.     Pupils: Pupils are equal, round, and reactive to light.  Neck:     Musculoskeletal: Normal range of motion and neck supple. No neck rigidity or muscular tenderness.     Thyroid: No thyromegaly.     Vascular: No carotid bruit or JVD.     Trachea: No tracheal deviation.  Cardiovascular:     Rate and Rhythm: Normal rate and regular rhythm.     Heart sounds: Normal heart sounds. No murmur. No friction rub. No gallop.   Pulmonary:     Effort: Pulmonary effort is normal. No respiratory distress.     Breath sounds: Normal breath sounds. No stridor. No wheezing, rhonchi or rales.  Chest:     Chest wall: No tenderness.  Abdominal:     General: Bowel sounds are normal. There is no distension.     Palpations: Abdomen is soft. There is no mass.      Tenderness: There is no abdominal tenderness. There is no guarding or rebound.     Hernia: No hernia is present.  Musculoskeletal: Normal range of motion.        General: No swelling, tenderness, deformity or signs of injury.  Right lower leg: No edema.     Left lower leg: No edema.  Lymphadenopathy:     Cervical: No cervical adenopathy.  Skin:    General: Skin is warm and dry.     Capillary Refill: Capillary refill takes less than 2 seconds.     Coloration: Skin is not jaundiced or pale.     Findings: No bruising, erythema, lesion or rash.  Neurological:     General: No focal deficit present.     Mental Status: He is alert and oriented to person, place, and time.     Cranial Nerves: No cranial nerve deficit.     Sensory: No sensory deficit.     Motor: No weakness or abnormal muscle tone.     Coordination: Coordination normal.     Gait: Gait normal.     Deep Tendon Reflexes: Reflexes are normal and symmetric. Reflexes normal.  Psychiatric:        Mood and Affect: Mood normal.        Behavior: Behavior normal.        Thought Content: Thought content normal.        Judgment: Judgment normal.       Assessment & Plan:  1. Routine general medical examination at a health care facility - Benign exam  - Continue to stay active and eat healthy  - Follow up in one year or sooner if needed - CBC with Differential/Platelet - Comprehensive metabolic panel - Lipid panel - TSH  2. Hereditary hemochromatosis (HCC)  - CBC with Differential/Platelet - IBC + Ferritin - Ambulatory referral to Hematology / Oncology  3. Essential hypertension - Well controlled.  - No change in medications - CBC with Differential/Platelet - Comprehensive metabolic panel - Lipid panel - TSH  Shirline Frees, NP

## 2019-11-17 ENCOUNTER — Other Ambulatory Visit: Payer: Self-pay | Admitting: Adult Health

## 2019-11-17 DIAGNOSIS — I10 Essential (primary) hypertension: Secondary | ICD-10-CM

## 2019-12-31 ENCOUNTER — Encounter: Payer: Self-pay | Admitting: Hematology & Oncology

## 2019-12-31 ENCOUNTER — Inpatient Hospital Stay: Payer: 59 | Attending: Hematology & Oncology | Admitting: Hematology & Oncology

## 2019-12-31 ENCOUNTER — Other Ambulatory Visit: Payer: Self-pay

## 2019-12-31 ENCOUNTER — Inpatient Hospital Stay: Payer: 59

## 2019-12-31 DIAGNOSIS — Z79899 Other long term (current) drug therapy: Secondary | ICD-10-CM | POA: Diagnosis not present

## 2019-12-31 LAB — CBC WITH DIFFERENTIAL (CANCER CENTER ONLY)
Abs Immature Granulocytes: 0.01 10*3/uL (ref 0.00–0.07)
Basophils Absolute: 0 10*3/uL (ref 0.0–0.1)
Basophils Relative: 1 %
Eosinophils Absolute: 0.2 10*3/uL (ref 0.0–0.5)
Eosinophils Relative: 3 %
HCT: 45.9 % (ref 39.0–52.0)
Hemoglobin: 16.6 g/dL (ref 13.0–17.0)
Immature Granulocytes: 0 %
Lymphocytes Relative: 40 %
Lymphs Abs: 2 10*3/uL (ref 0.7–4.0)
MCH: 32.1 pg (ref 26.0–34.0)
MCHC: 36.2 g/dL — ABNORMAL HIGH (ref 30.0–36.0)
MCV: 88.8 fL (ref 80.0–100.0)
Monocytes Absolute: 0.4 10*3/uL (ref 0.1–1.0)
Monocytes Relative: 9 %
Neutro Abs: 2.3 10*3/uL (ref 1.7–7.7)
Neutrophils Relative %: 47 %
Platelet Count: 164 10*3/uL (ref 150–400)
RBC: 5.17 MIL/uL (ref 4.22–5.81)
RDW: 11.4 % — ABNORMAL LOW (ref 11.5–15.5)
WBC Count: 5 10*3/uL (ref 4.0–10.5)
nRBC: 0 % (ref 0.0–0.2)

## 2019-12-31 LAB — CMP (CANCER CENTER ONLY)
ALT: 36 U/L (ref 0–44)
AST: 23 U/L (ref 15–41)
Albumin: 4.7 g/dL (ref 3.5–5.0)
Alkaline Phosphatase: 82 U/L (ref 38–126)
Anion gap: 8 (ref 5–15)
BUN: 16 mg/dL (ref 6–20)
CO2: 30 mmol/L (ref 22–32)
Calcium: 10 mg/dL (ref 8.9–10.3)
Chloride: 101 mmol/L (ref 98–111)
Creatinine: 1.11 mg/dL (ref 0.61–1.24)
GFR, Est AFR Am: >60 mL/min (ref >60)
GFR, Estimated: >60 mL/min (ref >60)
Glucose, Bld: 97 mg/dL (ref 70–99)
Potassium: 3.8 mmol/L (ref 3.5–5.1)
Sodium: 139 mmol/L (ref 135–145)
Total Bilirubin: 0.6 mg/dL (ref 0.3–1.2)
Total Protein: 7.4 g/dL (ref 6.5–8.1)

## 2019-12-31 NOTE — Progress Notes (Signed)
Hematology and Oncology Follow Up Visit  Chase Roy [id number] Jul 31, 1986 34 y.o. 12/31/2019   Principle Diagnosis:   Hemochromatosis-homozygous for the C282Y mutation  Current Therapy:    Patient donates blood to the [employer] once or twice a year.     Interim History:  Chase Roy is in for his first office visit.  He has been followed up with the [place] center.  However, he wanted to be seen at the [facility] because of the stability and the doctors here.  He was diagnosed with hemochromatosis back in 2012.  He has been donating blood.  He says he goes maybe once a year.  He has had no complications from hemochromatosis.  He has had no arthritic issues.  There is no diabetes.  There is no hypothyroidism.  He says that hemochromatosis runs on his mother side of the family.  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  They have a daughter.  He has had no problems with cough or shortness of breath.  He has had no nausea or vomiting.  He has had no change in bowel or bladder habits.  The last iron studies I have on him were from August 2020.  His ferritin was only 13.  His iron saturation was 36%.  He does not smoke.  He rarely has a alcoholic beverage.  Overall, his performance status is ECOG 0.  Medications:  Current Outpatient Medications:  .  Ibuprofen (MOTRIN PO), Take by mouth as needed., Disp: , Rfl:  .  lisinopril (ZESTRIL) 2.5 MG tablet, TAKE 1 TABLET BY MOUTH EVERY DAY, Disp: 30 tablet, Rfl: 2 .  loratadine (CLARITIN) 10 MG tablet, Take 10 mg by mouth daily., Disp: , Rfl:   Allergies: No Known Allergies  Past Medical History, Surgical history, Social history, and Family History were reviewed and updated.  Review of Systems: Review of Systems  Constitutional: Negative.   HENT:  Negative.   Eyes: Negative.   Respiratory: Negative.   Cardiovascular: Negative.   Gastrointestinal: Negative.    Endocrine: Negative.   Genitourinary: Negative.    Musculoskeletal: Negative.   Skin: Negative.   Neurological: Negative.   Hematological: Negative.   Psychiatric/Behavioral: Negative.     Physical Exam:  weight is 166 lb (75.3 kg). His oral temperature is 97.8 F (36.6 C). His blood pressure is 147/103 (abnormal) and his pulse is 66. His oxygen saturation is 99%.   Wt Readings from Last 3 Encounters:  12/31/19 166 lb (75.3 kg)  07/03/19 160 lb (72.6 kg)  11/20/18 166 lb (75.3 kg)    Physical Exam Vitals reviewed.  HENT:     Head: Normocephalic and atraumatic.  Eyes:     Pupils: Pupils are equal, round, and reactive to light.  Cardiovascular:     Rate and Rhythm: Normal rate and regular rhythm.     Heart sounds: Normal heart sounds.  Pulmonary:     Effort: Pulmonary effort is normal.     Breath sounds: Normal breath sounds.  Abdominal:     General: Bowel sounds are normal.     Palpations: Abdomen is soft.  Musculoskeletal:        General: No tenderness or deformity. Normal range of motion.     Cervical back: Normal range of motion.  Lymphadenopathy:     Cervical: No cervical adenopathy.  Skin:    General: Skin is warm and dry.  Findings: No erythema or rash.  Neurological:     Mental Status: He is alert and oriented to person, place, and time.  Psychiatric:        Behavior: Behavior normal.        Thought Content: Thought content normal.        Judgment: Judgment normal.      Lab Results  Component Value Date   WBC 5.0 12/31/2019   HGB 16.6 12/31/2019   HCT 45.9 12/31/2019   MCV 88.8 12/31/2019   PLT 164 12/31/2019     Chemistry      Component Value Date/Time   NA 139 12/31/2019 1041   K 3.8 12/31/2019 1041   CL 101 12/31/2019 1041   CO2 30 12/31/2019 1041   BUN 16 12/31/2019 1041   CREATININE 1.11 12/31/2019 1041      Component Value Date/Time   CALCIUM 10.0 12/31/2019 1041   ALKPHOS 82 12/31/2019 1041   AST 23 12/31/2019 1041   ALT 36  12/31/2019 1041   BILITOT 0.6 12/31/2019 1041      Impression and Plan: Chase Roy is a 34 year old white male.  He has hemochromatosis.  He is homozygous for the major C282Y mutation.  I would have to believe that his iron studies should be okay.  His MCV is not that high.  Provide told him that he can donate blood to [facility] 2 or 3 times a year.  His blood type is O+.  This is in need.  As such, he certainly has the blood count to be able to donate.  I really cannot find anything on his physical exam that would show [place] that there is a problem with iron overload.  When he had been seen the doctor every 6 months.  We will continue him on the every 32-month follow-up.  It was fun to talk to him.  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   [name], MD 2/22/20215:53 PM

## 2020-01-01 ENCOUNTER — Telehealth: Payer: Self-pay | Admitting: *Deleted

## 2020-01-01 LAB — IRON AND TIBC
Iron: 190 ug/dL — ABNORMAL HIGH (ref 42–163)
Saturation Ratios: 67 % — ABNORMAL HIGH (ref 20–55)
TIBC: 281 ug/dL (ref 202–409)
UIBC: 92 ug/dL — ABNORMAL LOW (ref 117–376)

## 2020-01-01 LAB — FERRITIN: Ferritin: 29 ng/mL (ref 24–336)

## 2020-01-01 NOTE — Telephone Encounter (Signed)
Pt notified per order of Dr. Myna Hidalgo that "the iron saturation is too high!!!  It is 67%.  I like this below 50%.  Please donate blood every 3 months!!!"  Pt states that he will donate blood every 3 months as ordered by Dr. Myna Hidalgo.  Pt appreciative of call and has no questions or concerns at this time.

## 2020-01-01 NOTE — Telephone Encounter (Signed)
-----   Message from [name], MD sent at 01/01/2020 10:30 AM EST ----- Call - the iron saturation is too high!!!  It is 67%.  I like this below 50%.  Please donate blood every 3 months!!!  [name]

## 2020-02-08 ENCOUNTER — Other Ambulatory Visit: Payer: Self-pay | Admitting: Adult Health

## 2020-02-08 DIAGNOSIS — I10 Essential (primary) hypertension: Secondary | ICD-10-CM

## 2020-02-11 NOTE — Telephone Encounter (Signed)
Sent to the pharmacy by e-scribe. 

## 2020-06-11 ENCOUNTER — Other Ambulatory Visit: Payer: Self-pay | Admitting: Adult Health

## 2020-06-11 DIAGNOSIS — I10 Essential (primary) hypertension: Secondary | ICD-10-CM

## 2020-06-11 NOTE — Telephone Encounter (Signed)
30 DAY SUPPLY SENT TO THE PHARMACY.  PT DUE FOR CPX. 

## 2020-06-30 ENCOUNTER — Inpatient Hospital Stay: Payer: 59 | Attending: Hematology & Oncology | Admitting: Hematology & Oncology

## 2020-06-30 ENCOUNTER — Inpatient Hospital Stay: Payer: 59

## 2020-06-30 ENCOUNTER — Encounter: Payer: Self-pay | Admitting: Hematology & Oncology

## 2020-06-30 DIAGNOSIS — Z79899 Other long term (current) drug therapy: Secondary | ICD-10-CM | POA: Diagnosis not present

## 2020-06-30 LAB — CBC WITH DIFFERENTIAL (CANCER CENTER ONLY)
Abs Immature Granulocytes: 0.01 10*3/uL (ref 0.00–0.07)
Basophils Absolute: 0.1 10*3/uL (ref 0.0–0.1)
Basophils Relative: 1 %
Eosinophils Absolute: 0.3 10*3/uL (ref 0.0–0.5)
Eosinophils Relative: 5 %
HCT: 43.3 % (ref 39.0–52.0)
Hemoglobin: 15.6 g/dL (ref 13.0–17.0)
Immature Granulocytes: 0 %
Lymphocytes Relative: 37 %
Lymphs Abs: 1.9 10*3/uL (ref 0.7–4.0)
MCH: 31.2 pg (ref 26.0–34.0)
MCHC: 36 g/dL (ref 30.0–36.0)
MCV: 86.6 fL (ref 80.0–100.0)
Monocytes Absolute: 0.4 10*3/uL (ref 0.1–1.0)
Monocytes Relative: 8 %
Neutro Abs: 2.5 10*3/uL (ref 1.7–7.7)
Neutrophils Relative %: 49 %
Platelet Count: 163 10*3/uL (ref 150–400)
RBC: 5 MIL/uL (ref 4.22–5.81)
RDW: 12.1 % (ref 11.5–15.5)
WBC Count: 5.2 10*3/uL (ref 4.0–10.5)
nRBC: 0 % (ref 0.0–0.2)

## 2020-06-30 LAB — CMP (CANCER CENTER ONLY)
ALT: 34 U/L (ref 0–44)
AST: 25 U/L (ref 15–41)
Albumin: 4.7 g/dL (ref 3.5–5.0)
Alkaline Phosphatase: 73 U/L (ref 38–126)
Anion gap: 9 (ref 5–15)
BUN: 13 mg/dL (ref 6–20)
CO2: 29 mmol/L (ref 22–32)
Calcium: 9.8 mg/dL (ref 8.9–10.3)
Chloride: 101 mmol/L (ref 98–111)
Creatinine: 1.19 mg/dL (ref 0.61–1.24)
GFR, Est AFR Am: >60 mL/min (ref >60)
GFR, Estimated: >60 mL/min (ref >60)
Glucose, Bld: 87 mg/dL (ref 70–99)
Potassium: 3.5 mmol/L (ref 3.5–5.1)
Sodium: 139 mmol/L (ref 135–145)
Total Bilirubin: 0.7 mg/dL (ref 0.3–1.2)
Total Protein: 7.2 g/dL (ref 6.5–8.1)

## 2020-06-30 NOTE — Progress Notes (Signed)
Hematology and Oncology Follow Up Visit  Chase Roy [id number] January 03, 1986 34 y.o. 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     Interim History:  Chase Roy is in for follow-up.  He is doing quite well.  He and his wife are expecting a second child in February.  I am very happy for him.  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  He has been staying quite busy.  He has had no problems with nausea or vomiting.  He has had no issues with blood donations.  When we last saw him in February, his ferritin was 29 with an iron saturation of 67%.  I think he is donated once since we last saw him.  I told him that he can certainly donate every 2 or 3 months.  His hemoglobin is high enough that he would have no problems with donating that often.  He has had no problems with fever.  He is watching very closely for the coronavirus.  He has been vaccinated.  He has had no rashes.  Is been no joint aches or pains.  He has had no cough or shortness of breath.  Overall, his performance status is ECOG 0.   Medications:  Current Outpatient Medications:  .  Ibuprofen (MOTRIN PO), Take by mouth as needed., Disp: , Rfl:  .  lisinopril (ZESTRIL) 2.5 MG tablet, Take 1 tablet (2.5 mg total) by mouth daily. **DUE FOR YEARLY PHYSICAL**, Disp: 30 tablet, Rfl: 0 .  loratadine (CLARITIN) 10 MG tablet, Take 10 mg by mouth daily., Disp: , Rfl:   Allergies: No Known Allergies  Past Medical History, Surgical history, Social history, and Family History were reviewed and updated.  Review of Systems: Review of Systems  Constitutional: Negative.   HENT:  Negative.   Eyes: Negative.   Respiratory: Negative.   Cardiovascular: Negative.   Gastrointestinal: Negative.   Endocrine: Negative.   Genitourinary: Negative.    Musculoskeletal: Negative.   Skin: Negative.   Neurological:  Negative.   Hematological: Negative.   Psychiatric/Behavioral: Negative.     Physical Exam:  height is 5\' 8"  (1.727 m) and weight is 166 lb (75.3 kg). His oral temperature is 98.1 F (36.7 C). His blood pressure is 130/90 and his pulse is 80. His respiration is 17 and oxygen saturation is 99%.   Wt Readings from Last 3 Encounters:  06/30/20 166 lb (75.3 kg)  12/31/19 166 lb (75.3 kg)  07/03/19 160 lb (72.6 kg)    Physical Exam Vitals reviewed.  HENT:     Head: Normocephalic and atraumatic.  Eyes:     Pupils: Pupils are equal, round, and reactive to light.  Cardiovascular:     Rate and Rhythm: Normal rate and regular rhythm.     Heart sounds: Normal heart sounds.  Pulmonary:     Effort: Pulmonary effort is normal.     Breath sounds: Normal breath sounds.  Abdominal:     General: Bowel sounds are normal.     Palpations: Abdomen is soft.  Musculoskeletal:        General: No tenderness or deformity. Normal range of motion.     Cervical back: Normal range of motion.  Lymphadenopathy:     Cervical: No cervical adenopathy.  Skin:    General: Skin is warm and dry.     Findings: No erythema or  rash.  Neurological:     Mental Status: He is alert and oriented to person, place, and time.  Psychiatric:        Behavior: Behavior normal.        Thought Content: Thought content normal.        Judgment: Judgment normal.      Lab Results  Component Value Date   WBC 5.2 06/30/2020   HGB 15.6 06/30/2020   HCT 43.3 06/30/2020   MCV 86.6 06/30/2020   PLT 163 06/30/2020     Chemistry      Component Value Date/Time   NA 139 06/30/2020 1134   K 3.5 06/30/2020 1134   CL 101 06/30/2020 1134   CO2 29 06/30/2020 1134   BUN 13 06/30/2020 1134   CREATININE 1.19 06/30/2020 1134      Component Value Date/Time   CALCIUM 9.8 06/30/2020 1134   ALKPHOS 73 06/30/2020 1134   AST 25 06/30/2020 1134   ALT 34 06/30/2020 1134   BILITOT 0.7 06/30/2020 1134      Impression and  Plan: Chase Roy is a 34 year old white male.  He has hemochromatosis.  He is homozygous for the major C282Y mutation.  We will see was iron studies are.  Again, I told that he can donate blood every couple months if he would like.  He has O+ blood type which I think is very helpful.  I think we can still get him back in 6 months.  Hopefully he will be able to donate blood every couple months.   [name], MD 8/23/202112:11 PM

## 2020-07-01 LAB — IRON AND TIBC
Iron: 157 ug/dL (ref 42–163)
Saturation Ratios: 55 % (ref 20–55)
TIBC: 285 ug/dL (ref 202–409)
UIBC: 128 ug/dL (ref 117–376)

## 2020-07-01 LAB — FERRITIN: Ferritin: 17 ng/mL — ABNORMAL LOW (ref 24–336)

## 2020-07-01 IMAGING — MR MR ABDOMEN WO/W CM
10 of 18 series · 24 of 48 positions shown · IV contrast (7 gadavist)
Comparison: CT scan 09/19/2011

CLINICAL DATA: Hemochromatosis with elevated liver enzymes.

EXAM:
MRI ABDOMEN WITHOUT AND WITH CONTRAST
TECHNIQUE: Multiplanar multisequence MR imaging of the abdomen was performed
both before and after the administration of intravenous contrast.
CONTRAST:  7 cc Gadavist.

[Series 3: T2 · coronal · 5.0mm · 1.01mm/px · 1 of 36 slices shown (1 of 2)]
[im 1/36]
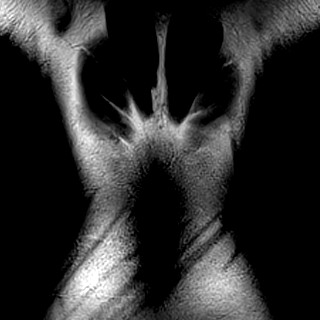

[Series 4: t2fs axial blade · axial · 4.0mm · 1.10mm/px · 1 of 52 slices shown]
[im 1/52]
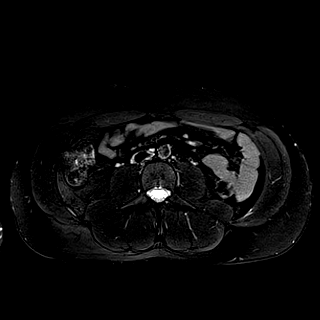

[Series 5: DWI · axial · 5.0mm · 0.91mm/px · z∈[-92,+154]mm · 3 of 84 slices shown (1 of 2)]
[im 1/84]
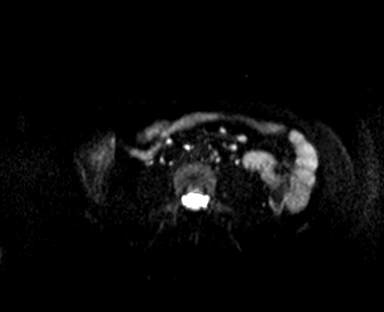
[im 42/84]
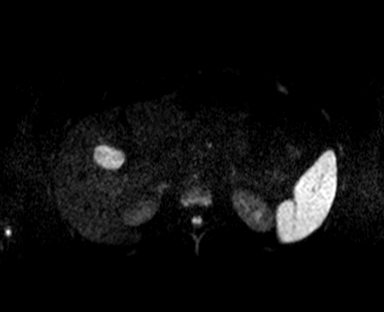
[im 84/84]
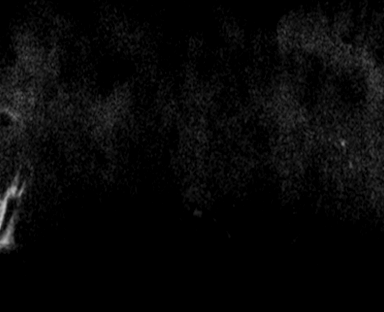

[Series 6: DWI · axial · 5.0mm · 0.91mm/px · z∈[-92,+154]mm · 2 of 42 slices shown (2 of 2)]
[im 1/42]
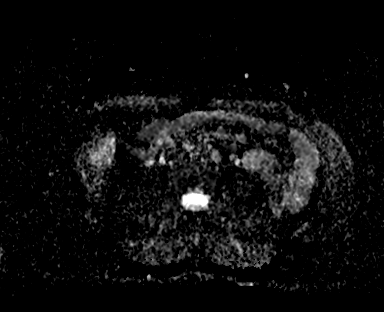
[im 42/42]
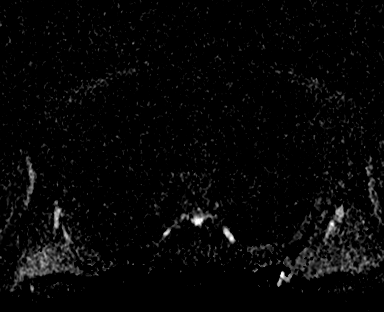

[Series 7: bSSFP · axial · 4.0mm · 0.68mm/px · z∈[-111,+173]mm · 3 of 72 slices shown]
[im 1/72]
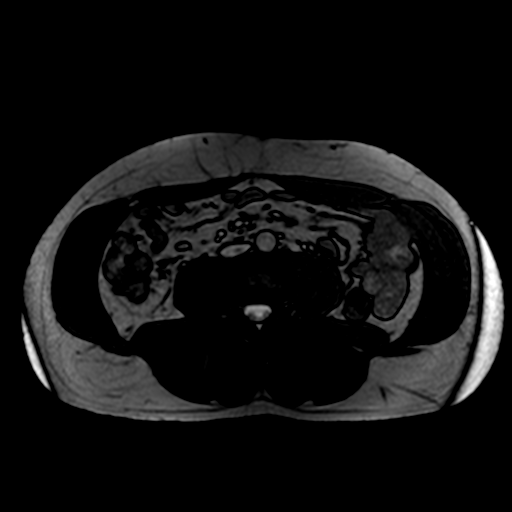
[im 36/72]
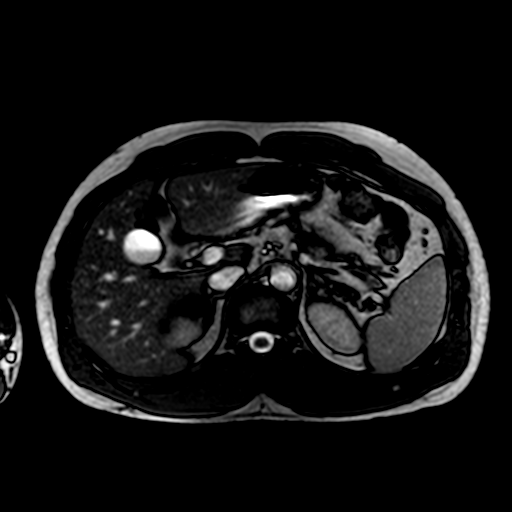
[im 72/72]
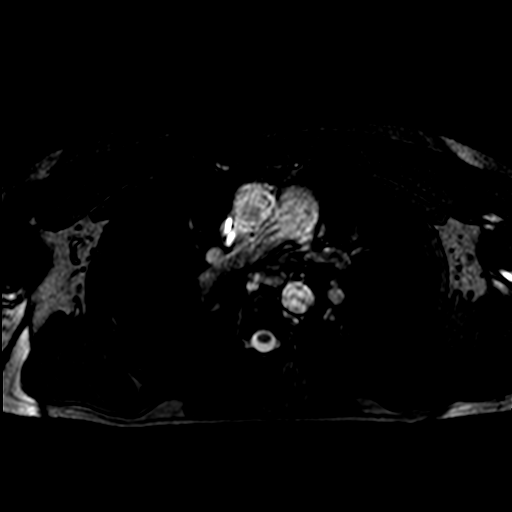

[Series 8: T1 · axial · 4.0mm · 0.55mm/px · z∈[-90,+162]mm · 3 of 64 slices shown (1 of 2)]
[im 1/64]
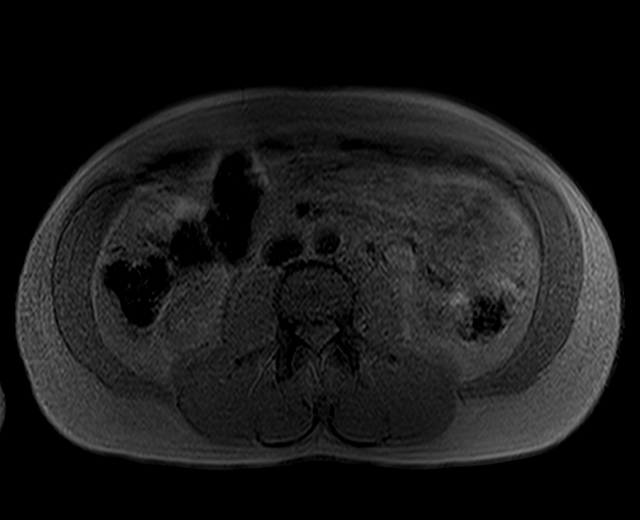
[im 32/64]
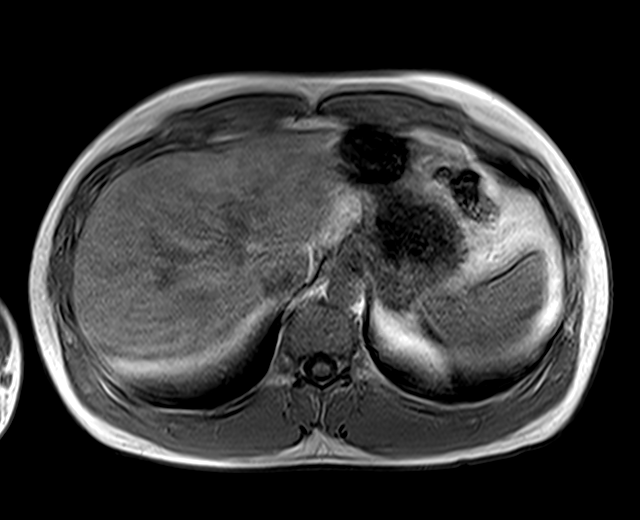
[im 64/64]
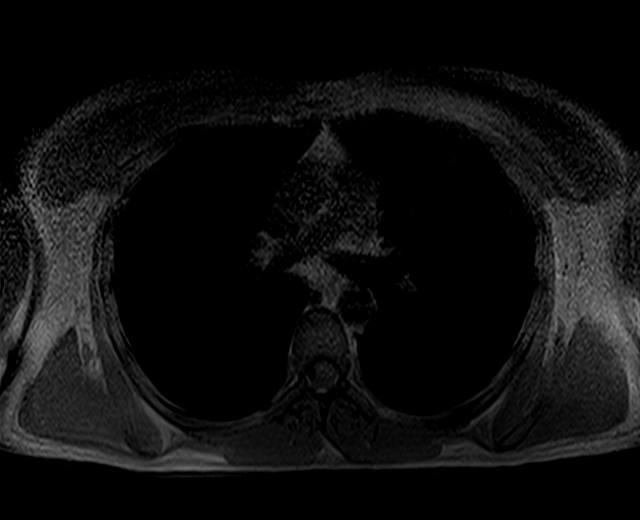

[Series 9: T1 · axial · 4.0mm · 0.55mm/px · z∈[-90,+162]mm · 3 of 64 slices shown (2 of 2)]
[im 1/64]
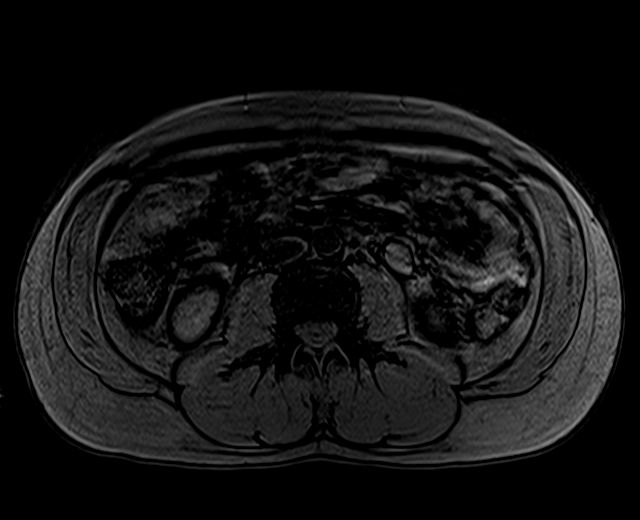
[im 32/64]
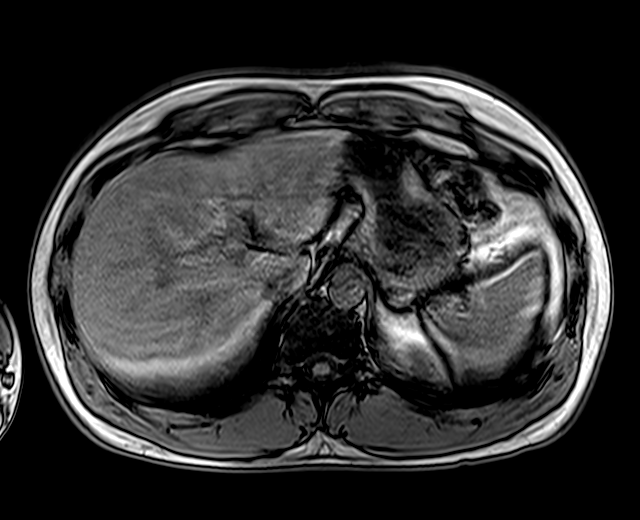
[im 64/64]
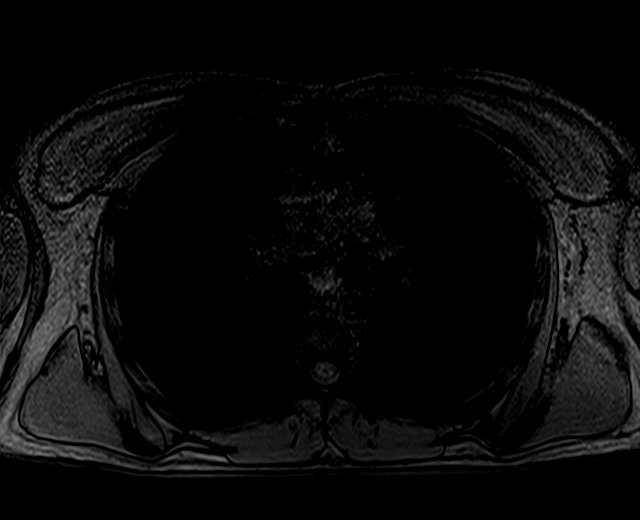

[Series 12: pre test · axial · non-contrast · 3.5mm · 0.56mm/px · z∈[-88,+161]mm · 3 of 72 slices shown]
[im 1/72]
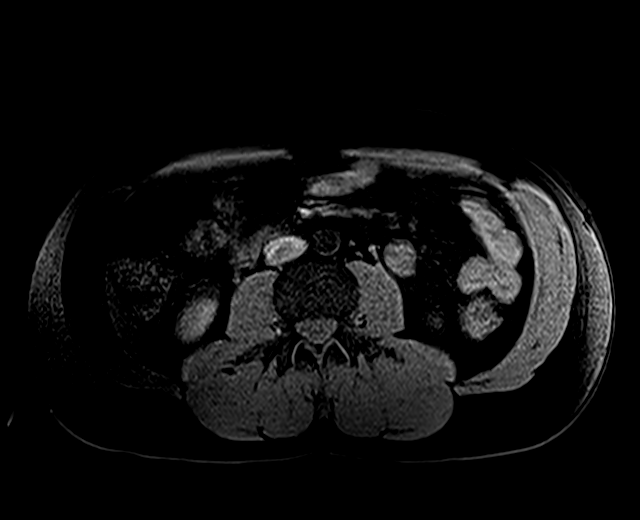
[im 36/72]
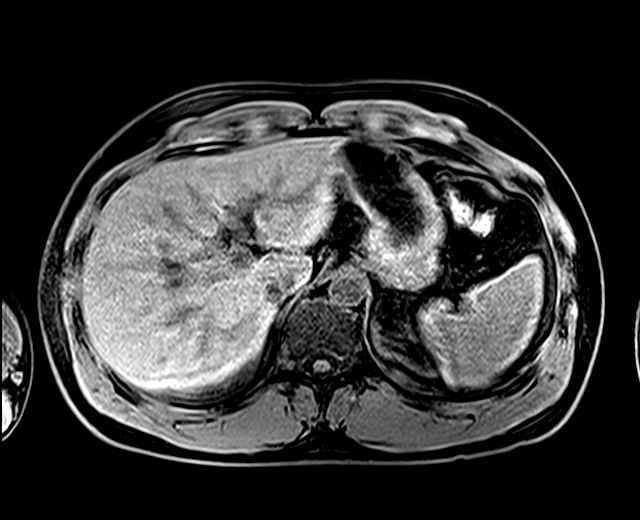
[im 72/72]
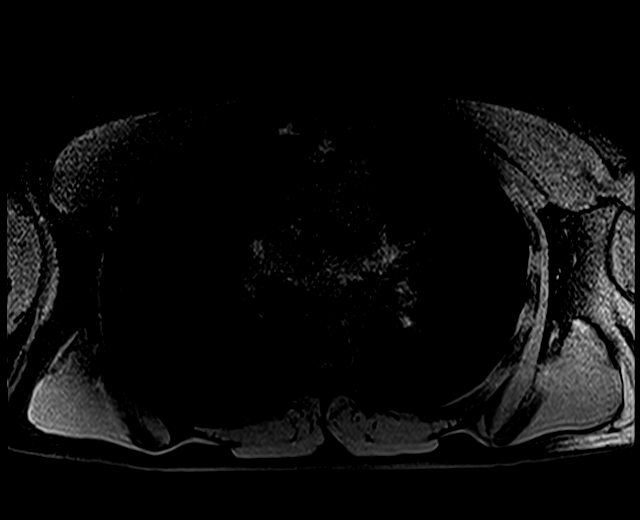

[Series 20: t1fs coronal post · coronal · 2.5mm · 1.12mm/px · 3 of 80 slices shown]
[im 1/80]
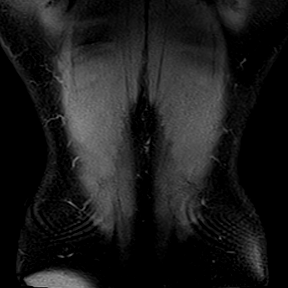
[im 40/80]
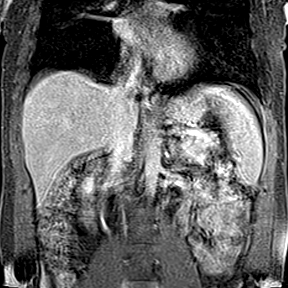
[im 80/80]
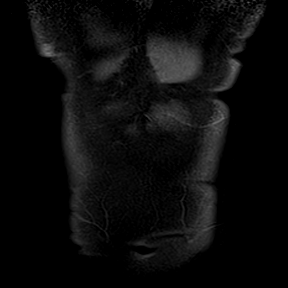

[Series 21: T2 · axial · 5.0mm · 1.37mm/px · z∈[-93,+165]mm · 2 of 44 slices shown (2 of 2)]
[im 1/44]
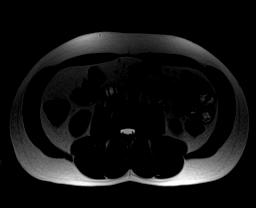
[im 44/44]
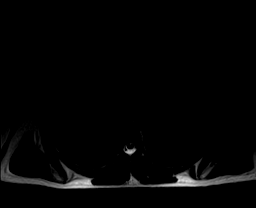

[24 of 48 positions shown; findings below may reference images not displayed]

FINDINGS: Lower chest: Unremarkable.

Hepatobiliary: No focal abnormality within the liver parenchyma. No
changes on inphase/out of phase imaging or FISP imaging to suggest
substantial iron deposition within the liver parenchyma. There is no
evidence for gallstones, gallbladder wall thickening, or
pericholecystic fluid. No intrahepatic or extrahepatic biliary
dilation.

Pancreas: No focal mass lesion. No dilatation of the main duct. No
intraparenchymal cyst. No peripancreatic edema.

Spleen:  No splenomegaly. No focal mass lesion.

Adrenals/Urinary Tract: No adrenal nodule or mass. Kidneys
unremarkable.

Stomach/Bowel: Stomach is nondistended. No gastric wall thickening.
No evidence of outlet obstruction. Duodenum is normally positioned
as is the ligament of Treitz. No small bowel or colonic dilatation
within the visualized abdomen.

Vascular/Lymphatic: No abdominal aortic aneurysm. No abdominal
lymphadenopathy.

Other:  No intraperitoneal free fluid.

Musculoskeletal: No abnormal marrow enhancement within the
visualized bony anatomy.
IMPRESSION: 1. No intra or extrahepatic biliary duct dilatation.
2. No signal abnormality within the liver parenchyma to suggest
substantial iron deposition.

## 2020-07-02 ENCOUNTER — Telehealth: Payer: Self-pay | Admitting: *Deleted

## 2020-07-02 NOTE — Telephone Encounter (Signed)
Notified pt of results and gave MD instructions. Pt verbalized understanding, no concerns at this time.

## 2020-07-02 NOTE — Telephone Encounter (Signed)
-----   Message from [name], MD sent at 07/01/2020  5:13 PM EDT ----- Call - the iron saturation is still a little high!!  Please donate to the [facility] every 2 months!!  [name]

## 2020-07-08 ENCOUNTER — Other Ambulatory Visit: Payer: Self-pay | Admitting: Adult Health

## 2020-07-08 DIAGNOSIS — I10 Essential (primary) hypertension: Secondary | ICD-10-CM

## 2020-07-17 ENCOUNTER — Other Ambulatory Visit: Payer: Self-pay | Admitting: Adult Health

## 2020-07-17 DIAGNOSIS — I10 Essential (primary) hypertension: Secondary | ICD-10-CM

## 2020-07-17 NOTE — Telephone Encounter (Signed)
lisinopril (ZESTRIL) 2.5 MG tablet  [id number] - [place], [place] - [address] AT [place] [address] Phone:  [phone]  Fax:  [phone]

## 2020-07-18 MED ORDER — LISINOPRIL 2.5 MG PO TABS: 2.5000 mg | ORAL_TABLET | Freq: Every day | ORAL | 0 refills | Status: DC

## 2020-07-18 NOTE — Telephone Encounter (Signed)
Rx sent in

## 2020-07-18 NOTE — Addendum Note (Signed)
Addended by: Kathreen Devoid on: 07/18/2020 02:13 PM   Modules accepted: Orders

## 2020-09-02 ENCOUNTER — Other Ambulatory Visit: Payer: Self-pay | Admitting: Adult Health

## 2020-09-02 DIAGNOSIS — I10 Essential (primary) hypertension: Secondary | ICD-10-CM

## 2020-09-12 ENCOUNTER — Encounter: Payer: Self-pay | Admitting: Adult Health

## 2020-09-12 ENCOUNTER — Other Ambulatory Visit: Payer: Self-pay

## 2020-09-12 ENCOUNTER — Ambulatory Visit (INDEPENDENT_AMBULATORY_CARE_PROVIDER_SITE_OTHER): Payer: 59 | Admitting: Adult Health

## 2020-09-12 VITALS — BP 128/90 | HR 71 | Temp 98.2°F | Ht 68.25 in | Wt 168.0 lb

## 2020-09-12 DIAGNOSIS — Z0000 Encounter for general adult medical examination without abnormal findings: Secondary | ICD-10-CM | POA: Diagnosis not present

## 2020-09-12 DIAGNOSIS — I10 Essential (primary) hypertension: Secondary | ICD-10-CM

## 2020-09-12 MED ORDER — LISINOPRIL 2.5 MG PO TABS: 2.5000 mg | ORAL_TABLET | Freq: Every day | ORAL | 3 refills | Status: DC

## 2020-09-12 NOTE — Progress Notes (Signed)
Subjective:    Patient ID: Chase Roy, male    DOB: [birth year], 34 y.o.   MRN: [id number]  HPI Patient presents for yearly preventative medicine examination. He is a pleasant 34 year old male who  has a past medical history of Chicken pox and Hemochromatosis.   He continues to work for [employer] and has been pretty busy for the last year, to say the least  Hemochromatosis -he is followed by Oncology, Dr. Jonette Eva.  Symptoms are controlled with therapeutic phlebotomy 2-3 times a year at the [facility].  He denies issues with nausea or vomiting.  Essential hypertension-he is prescribed lisinopril 2.5 mg.  He denies headaches, blurred vision, chest pain, shortness of breath, or syncopal episodes BP Readings from Last 3 Encounters:  09/12/20 128/90  06/30/20 130/90  12/31/19 (!) 147/103    All immunizations and health maintenance protocols were reviewed with the patient and needed orders were placed.  Appropriate screening laboratory values were ordered for the patient including screening of hyperlipidemia, renal function and hepatic function.  Medication reconciliation,  past medical history, social history, problem list and allergies were reviewed in detail with the patient  Goals were established with regard to weight loss, exercise, and  diet in compliance with medications.  He stays active and tries to eat a heart healthy diet Wt Readings from Last 3 Encounters:  09/12/20 168 lb (76.2 kg)  06/30/20 166 lb (75.3 kg)  12/31/19 166 lb (75.3 kg)    Review of Systems  Constitutional: Negative.   HENT: Negative.   Eyes: Negative.   Respiratory: Negative.   Cardiovascular: Negative.   Gastrointestinal: Negative.   Endocrine: Negative.   Genitourinary: Negative.   Musculoskeletal: Negative.   Skin: Negative.   Allergic/Immunologic: Negative.   Neurological: Negative.   Hematological: Negative.   Psychiatric/Behavioral: Negative.   All other systems reviewed and  are negative.  Past Medical History:  Diagnosis Date   Chicken pox    Hemochromatosis     Social History   Socioeconomic History   Marital status: Married    Spouse name: Not on file   Number of children: Not on file   Years of education: Not on file   Highest education level: Not on file  Occupational History   Not on file  Tobacco Use   Smoking status: Former Smoker    Types: Cigarettes    Quit date: 11/02/2006    Years since quitting: 13.8   Smokeless tobacco: Former Systems developer    Types: Snuff  Substance and Sexual Activity   Alcohol use: Yes   Drug use: No   Sexual activity: Yes  Other Topics Concern   Not on file  Social History Narrative   Not on file   Social Determinants of Health   Financial Resource Strain:    Difficulty of Paying Living Expenses: Not on file  Food Insecurity:    Worried About Charity fundraiser in the Last Year: Not on file   [employer] of Food in the Last Year: Not on file  Transportation Needs:    Lack of Transportation (Medical): Not on file   Lack of Transportation (Non-Medical): Not on file  Physical Activity:    Days of Exercise per Week: Not on file   Minutes of Exercise per Session: Not on file  Stress:    Feeling of Stress : Not on file  Social Connections:    Frequency of Communication with Friends and Family: Not on file  Frequency of Social Gatherings with Friends and Family: Not on file   Attends Religious Services: Not on file   Active Member of Clubs or Organizations: Not on file   Attends Archivist Meetings: Not on file   Marital Status: Not on file  Intimate Partner Violence:    Fear of Current or Ex-Partner: Not on file   Emotionally Abused: Not on file   Physically Abused: Not on file   Sexually Abused: Not on file    Past Surgical History:  Procedure Laterality Date   HAND SURGERY     KNEE SURGERY     TONSILLECTOMY  1990    Family History  Problem Relation Age  of Onset   Hypertension Mother    Hypertension Father    Hemochromatosis Sister    Anemia Sister     No Known Allergies  Current Outpatient Medications on File Prior to Visit  Medication Sig Dispense Refill   Ibuprofen (MOTRIN PO) Take by mouth as needed.     lisinopril (ZESTRIL) 2.5 MG tablet TAKE 1 TABLET (2.5 MG TOTAL) BY MOUTH DAILY. **DUE FOR YEARLY PHYSICAL** 30 tablet 0   loratadine (CLARITIN) 10 MG tablet Take 10 mg by mouth daily.     No current facility-administered medications on file prior to visit.    BP 128/90 (BP Location: Right Arm, Patient Position: Sitting, Cuff Size: Normal)    Pulse 71    Temp 98.2 F (36.8 C) (Oral)    Ht 5' 8.25" (1.734 m)    Wt 168 lb (76.2 kg)    SpO2 97%    BMI 25.36 kg/m       Objective:   Physical Exam Vitals and nursing note reviewed.  Constitutional:      General: He is not in acute distress.    Appearance: Normal appearance. He is well-developed and normal weight.  HENT:     Head: Normocephalic and atraumatic.     Right Ear: Tympanic membrane, ear canal and external ear normal. There is no impacted cerumen.     Left Ear: Tympanic membrane, ear canal and external ear normal. There is no impacted cerumen.     Nose: Nose normal. No congestion or rhinorrhea.     Mouth/Throat:     Mouth: Mucous membranes are moist.     Pharynx: Oropharynx is clear. No oropharyngeal exudate or posterior oropharyngeal erythema.  Eyes:     General:        Right eye: No discharge.        Left eye: No discharge.     Extraocular Movements: Extraocular movements intact.     Conjunctiva/sclera: Conjunctivae normal.     Pupils: Pupils are equal, round, and reactive to light.  Neck:     Vascular: No carotid bruit.     Trachea: No tracheal deviation.  Cardiovascular:     Rate and Rhythm: Normal rate and regular rhythm.     Pulses: Normal pulses.     Heart sounds: Normal heart sounds. No murmur heard.  No friction rub. No gallop.   Pulmonary:       Effort: Pulmonary effort is normal. No respiratory distress.     Breath sounds: Normal breath sounds. No stridor. No wheezing, rhonchi or rales.  Chest:     Chest wall: No tenderness.  Abdominal:     General: Bowel sounds are normal. There is no distension.     Palpations: Abdomen is soft. There is no mass.     Tenderness: There  is no abdominal tenderness. There is no right CVA tenderness, left CVA tenderness, guarding or rebound.     Hernia: No hernia is present.  Musculoskeletal:        General: No swelling, tenderness, deformity or signs of injury. Normal range of motion.     Right lower leg: No edema.     Left lower leg: No edema.  Lymphadenopathy:     Cervical: No cervical adenopathy.  Skin:    General: Skin is warm and dry.     Capillary Refill: Capillary refill takes less than 2 seconds.     Coloration: Skin is not jaundiced or pale.     Findings: No bruising, erythema, lesion or rash.  Neurological:     General: No focal deficit present.     Mental Status: He is alert and oriented to person, place, and time.     Cranial Nerves: No cranial nerve deficit.     Sensory: No sensory deficit.     Motor: No weakness.     Coordination: Coordination normal.     Gait: Gait normal.     Deep Tendon Reflexes: Reflexes normal.  Psychiatric:        Mood and Affect: Mood normal.        Behavior: Behavior normal.        Thought Content: Thought content normal.        Judgment: Judgment normal.       Assessment & Plan:  1. Routine general medical examination at a health care facility - Healthy male - benign exam  - Follow up in one year  - CBC with Differential/Platelet; Future - Hemoglobin A1c; Future - Lipid panel; Future - TSH; Future - CMP with eGFR([facility]); Future  2. Hereditary hemochromatosis ([place]) - Follow up with Oncology daily.  - CBC with Differential/Platelet; Future - Hemoglobin A1c; Future - Lipid panel; Future - TSH; Future - CMP with eGFR([facility]);  Future  3. Essential hypertension - Well controlled.  - CBC with Differential/Platelet; Future - Hemoglobin A1c; Future - Lipid panel; Future - TSH; Future - CMP with eGFR([facility]); Future - lisinopril (ZESTRIL) 2.5 MG tablet; Take 1 tablet (2.5 mg total) by mouth daily.  Dispense: 90 tablet; Refill: 3  Dorothyann Peng, NP

## 2020-09-13 LAB — COMPLETE METABOLIC PANEL WITH GFR
AG Ratio: 1.7 (calc) (ref 1.0–2.5)
ALT: 41 U/L (ref 9–46)
AST: 25 U/L (ref 10–40)
Albumin: 4.7 g/dL (ref 3.6–5.1)
Alkaline phosphatase (APISO): 86 U/L (ref 36–130)
BUN: 15 mg/dL (ref 7–25)
CO2: 26 mmol/L (ref 20–32)
Calcium: 10.1 mg/dL (ref 8.6–10.3)
Chloride: 103 mmol/L (ref 98–110)
Creat: 1.05 mg/dL (ref 0.60–1.35)
GFR, Est African American: 106 mL/min/{1.73_m2} (ref >=60)
GFR, Est Non African American: 92 mL/min/{1.73_m2} (ref >=60)
Globulin: 2.8 g/dL (calc) (ref 1.9–3.7)
Glucose, Bld: 81 mg/dL (ref 65–99)
Potassium: 4 mmol/L (ref 3.5–5.3)
Sodium: 139 mmol/L (ref 135–146)
Total Bilirubin: 0.9 mg/dL (ref 0.2–1.2)
Total Protein: 7.5 g/dL (ref 6.1–8.1)

## 2020-09-13 LAB — LIPID PANEL
Cholesterol: 193 mg/dL (ref <200)
HDL: 62 mg/dL (ref >=40)
LDL Cholesterol (Calc): 104 mg/dL (calc) — ABNORMAL HIGH
Non-HDL Cholesterol (Calc): 131 mg/dL (calc) — ABNORMAL HIGH (ref <130)
Total CHOL/HDL Ratio: 3.1 (calc) (ref <5.0)
Triglycerides: 159 mg/dL — ABNORMAL HIGH (ref <150)

## 2020-09-13 LAB — CBC WITH DIFFERENTIAL/PLATELET
Absolute Monocytes: 536 cells/uL (ref 200–950)
Basophils Absolute: 41 cells/uL (ref 0–200)
Basophils Relative: 0.9 %
Eosinophils Absolute: 216 cells/uL (ref 15–500)
Eosinophils Relative: 4.8 %
HCT: 49.7 % (ref 38.5–50.0)
Hemoglobin: 17.4 g/dL — ABNORMAL HIGH (ref 13.2–17.1)
Lymphs Abs: 1962 cells/uL (ref 850–3900)
MCH: 32 pg (ref 27.0–33.0)
MCHC: 35 g/dL (ref 32.0–36.0)
MCV: 91.4 fL (ref 80.0–100.0)
MPV: 10.1 fL (ref 7.5–12.5)
Monocytes Relative: 11.9 %
Neutro Abs: 1746 cells/uL (ref 1500–7800)
Neutrophils Relative %: 38.8 %
Platelets: 178 10*3/uL (ref 140–400)
RBC: 5.44 10*6/uL (ref 4.20–5.80)
RDW: 12.3 % (ref 11.0–15.0)
Total Lymphocyte: 43.6 %
WBC: 4.5 10*3/uL (ref 3.8–10.8)

## 2020-09-13 LAB — TSH: TSH: 1.25 mIU/L (ref 0.40–4.50)

## 2020-09-13 LAB — HEMOGLOBIN A1C
Hgb A1c MFr Bld: 4.8 % of total Hgb (ref <5.7)
Mean Plasma Glucose: 91 (calc)
eAG (mmol/L): 5 (calc)

## 2020-12-11 ENCOUNTER — Inpatient Hospital Stay: Payer: 59 | Attending: Hematology & Oncology | Admitting: Hematology & Oncology

## 2020-12-11 ENCOUNTER — Other Ambulatory Visit: Payer: Self-pay

## 2020-12-11 ENCOUNTER — Encounter: Payer: Self-pay | Admitting: Hematology & Oncology

## 2020-12-11 ENCOUNTER — Inpatient Hospital Stay: Payer: 59

## 2020-12-11 LAB — CMP (CANCER CENTER ONLY)
ALT: 33 U/L (ref 0–44)
AST: 23 U/L (ref 15–41)
Albumin: 4.4 g/dL (ref 3.5–5.0)
Alkaline Phosphatase: 71 U/L (ref 38–126)
Anion gap: 4 — ABNORMAL LOW (ref 5–15)
BUN: 15 mg/dL (ref 6–20)
CO2: 33 mmol/L — ABNORMAL HIGH (ref 22–32)
Calcium: 10 mg/dL (ref 8.9–10.3)
Chloride: 99 mmol/L (ref 98–111)
Creatinine: 1.12 mg/dL (ref 0.61–1.24)
GFR, Estimated: >60 mL/min (ref >60)
Glucose, Bld: 67 mg/dL — ABNORMAL LOW (ref 70–99)
Potassium: 4.1 mmol/L (ref 3.5–5.1)
Sodium: 136 mmol/L (ref 135–145)
Total Bilirubin: 0.6 mg/dL (ref 0.3–1.2)
Total Protein: 7 g/dL (ref 6.5–8.1)

## 2020-12-11 LAB — IRON AND TIBC
Iron: 159 ug/dL (ref 42–163)
Saturation Ratios: 52 % (ref 20–55)
TIBC: 305 ug/dL (ref 202–409)
UIBC: 146 ug/dL (ref 117–376)

## 2020-12-11 LAB — CBC WITH DIFFERENTIAL (CANCER CENTER ONLY)
Abs Immature Granulocytes: 0.01 10*3/uL (ref 0.00–0.07)
Basophils Absolute: 0.1 10*3/uL (ref 0.0–0.1)
Basophils Relative: 1 %
Eosinophils Absolute: 0.2 10*3/uL (ref 0.0–0.5)
Eosinophils Relative: 5 %
HCT: 45.3 % (ref 39.0–52.0)
Hemoglobin: 16 g/dL (ref 13.0–17.0)
Immature Granulocytes: 0 %
Lymphocytes Relative: 45 %
Lymphs Abs: 1.9 10*3/uL (ref 0.7–4.0)
MCH: 30.6 pg (ref 26.0–34.0)
MCHC: 35.3 g/dL (ref 30.0–36.0)
MCV: 86.6 fL (ref 80.0–100.0)
Monocytes Absolute: 0.5 10*3/uL (ref 0.1–1.0)
Monocytes Relative: 12 %
Neutro Abs: 1.6 10*3/uL — ABNORMAL LOW (ref 1.7–7.7)
Neutrophils Relative %: 37 %
Platelet Count: 171 10*3/uL (ref 150–400)
RBC: 5.23 MIL/uL (ref 4.22–5.81)
RDW: 11.9 % (ref 11.5–15.5)
WBC Count: 4.2 10*3/uL (ref 4.0–10.5)
nRBC: 0 % (ref 0.0–0.2)

## 2020-12-11 LAB — FERRITIN: Ferritin: 10 ng/mL — ABNORMAL LOW (ref 24–336)

## 2020-12-11 NOTE — Progress Notes (Signed)
Hematology and Oncology Follow Up Visit  Chase Roy [id number] 1986-11-07 35 y.o. 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     Interim History:  Chase Roy is in for follow-up.  The big news is that his wife is about to have their second child.  It will be a baby girl.  She is due on February 12.  He is doing quite well.  He is donating blood to the [facility].  His last iron studies looked fantastic.  His ferritin was 17 with iron saturation of 55%.  He has had no problems with fatigue or weakness.  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  He has had no change in bowel or bladder habits.  There has been no rashes.  He has had no cough or shortness of breath.  Overall, his performance status is ECOG 0.    Medications:  Current Outpatient Medications:  .  Ibuprofen (MOTRIN PO), Take by mouth as needed., Disp: , Rfl:  .  lisinopril (ZESTRIL) 2.5 MG tablet, Take 1 tablet (2.5 mg total) by mouth daily., Disp: 90 tablet, Rfl: 3 .  loratadine (CLARITIN) 10 MG tablet, Take 10 mg by mouth daily., Disp: , Rfl:   Allergies: No Known Allergies  Past Medical History, Surgical history, Social history, and Family History were reviewed and updated.  Review of Systems: Review of Systems  Constitutional: Negative.   HENT:  Negative.   Eyes: Negative.   Respiratory: Negative.   Cardiovascular: Negative.   Gastrointestinal: Negative.   Endocrine: Negative.   Genitourinary: Negative.    Musculoskeletal: Negative.   Skin: Negative.   Neurological: Negative.   Hematological: Negative.   Psychiatric/Behavioral: Negative.     Physical Exam:  weight is 170 lb 0.6 oz (77.1 kg). His oral temperature is 98 F (36.7 C). His blood pressure is 149/85 (abnormal) and his pulse is 65. His respiration is 20 and oxygen saturation is 100%.   Wt  Readings from Last 3 Encounters:  12/11/20 170 lb 0.6 oz (77.1 kg)  09/12/20 168 lb (76.2 kg)  06/30/20 166 lb (75.3 kg)    Physical Exam Vitals reviewed.  HENT:     Head: Normocephalic and atraumatic.  Eyes:     Pupils: Pupils are equal, round, and reactive to light.  Cardiovascular:     Rate and Rhythm: Normal rate and regular rhythm.     Heart sounds: Normal heart sounds.  Pulmonary:     Effort: Pulmonary effort is normal.     Breath sounds: Normal breath sounds.  Abdominal:     General: Bowel sounds are normal.     Palpations: Abdomen is soft.  Musculoskeletal:        General: No tenderness or deformity. Normal range of motion.     Cervical back: Normal range of motion.  Lymphadenopathy:     Cervical: No cervical adenopathy.  Skin:    General: Skin is warm and dry.     Findings: No erythema or rash.  Neurological:     Mental Status: He is alert and oriented to person, place, and time.  Psychiatric:        Behavior: Behavior normal.        Thought Content: Thought content normal.        Judgment: Judgment normal.      Lab Results  Component Value Date   WBC 4.2 12/11/2020   HGB 16.0 12/11/2020   HCT 45.3 12/11/2020   MCV 86.6 12/11/2020   PLT 171 12/11/2020     Chemistry      Component Value Date/Time   NA 136 12/11/2020 0818   K 4.1 12/11/2020 0818   CL 99 12/11/2020 0818   CO2 33 (H) 12/11/2020 0818   BUN 15 12/11/2020 0818   CREATININE 1.12 12/11/2020 0818   CREATININE 1.05 09/12/2020 0840      Component Value Date/Time   CALCIUM 10.0 12/11/2020 0818   ALKPHOS 71 12/11/2020 0818   AST 23 12/11/2020 0818   ALT 33 12/11/2020 0818   BILITOT 0.6 12/11/2020 0818      Impression and Plan: Chase Roy is a 35 year old white male.  He has hemochromatosis.  He is homozygous for the major C282Y mutation.  We will see was iron studies are.  Again he is donating blood every couple months, if he would like.Marland Kitchen  He has O+ blood type which I think is very  helpful.  I think we can still get him back in 6 months.  Hopefully he will be able to donate blood every couple months.   I look forward to seeing pictures of his new baby girl.  [name], MD 2/3/20229:14 AM

## 2020-12-12 ENCOUNTER — Telehealth: Payer: Self-pay | Admitting: *Deleted

## 2020-12-12 NOTE — Telephone Encounter (Signed)
Notified pt of results and MD instructions. Pt verbalized understanding.no concerns at this time.

## 2020-12-12 NOTE — Telephone Encounter (Signed)
-----   Message from [name], MD sent at 12/11/2020  5:32 PM EST ----- Call - the iron levels are great!!!  Keep donating to the [facility]!!! [name]

## 2021-05-13 ENCOUNTER — Encounter: Payer: Self-pay | Admitting: Hematology & Oncology

## 2021-05-13 ENCOUNTER — Other Ambulatory Visit: Payer: Self-pay

## 2021-05-13 ENCOUNTER — Inpatient Hospital Stay: Payer: 59 | Attending: Hematology & Oncology

## 2021-05-13 ENCOUNTER — Telehealth: Payer: Self-pay

## 2021-05-13 ENCOUNTER — Inpatient Hospital Stay (HOSPITAL_BASED_OUTPATIENT_CLINIC_OR_DEPARTMENT_OTHER): Payer: 59 | Admitting: Hematology & Oncology

## 2021-05-13 LAB — IRON AND TIBC
Iron: 86 ug/dL (ref 42–163)
Saturation Ratios: 28 % (ref 20–55)
TIBC: 305 ug/dL (ref 202–409)
UIBC: 220 ug/dL (ref 117–376)

## 2021-05-13 LAB — CMP (CANCER CENTER ONLY)
ALT: 31 U/L (ref 0–44)
AST: 24 U/L (ref 15–41)
Albumin: 4.5 g/dL (ref 3.5–5.0)
Alkaline Phosphatase: 85 U/L (ref 38–126)
Anion gap: 8 (ref 5–15)
BUN: 13 mg/dL (ref 6–20)
CO2: 30 mmol/L (ref 22–32)
Calcium: 9.9 mg/dL (ref 8.9–10.3)
Chloride: 101 mmol/L (ref 98–111)
Creatinine: 1.12 mg/dL (ref 0.61–1.24)
GFR, Estimated: >60 mL/min (ref >60)
Glucose, Bld: 116 mg/dL — ABNORMAL HIGH (ref 70–99)
Potassium: 3.7 mmol/L (ref 3.5–5.1)
Sodium: 139 mmol/L (ref 135–145)
Total Bilirubin: 0.5 mg/dL (ref 0.3–1.2)
Total Protein: 7.1 g/dL (ref 6.5–8.1)

## 2021-05-13 LAB — CBC WITH DIFFERENTIAL (CANCER CENTER ONLY)
Abs Immature Granulocytes: 0.01 10*3/uL (ref 0.00–0.07)
Basophils Absolute: 0 10*3/uL (ref 0.0–0.1)
Basophils Relative: 1 %
Eosinophils Absolute: 0.2 10*3/uL (ref 0.0–0.5)
Eosinophils Relative: 5 %
HCT: 45.3 % (ref 39.0–52.0)
Hemoglobin: 16.2 g/dL (ref 13.0–17.0)
Immature Granulocytes: 0 %
Lymphocytes Relative: 41 %
Lymphs Abs: 1.5 10*3/uL (ref 0.7–4.0)
MCH: 31.4 pg (ref 26.0–34.0)
MCHC: 35.8 g/dL (ref 30.0–36.0)
MCV: 87.8 fL (ref 80.0–100.0)
Monocytes Absolute: 0.3 10*3/uL (ref 0.1–1.0)
Monocytes Relative: 8 %
Neutro Abs: 1.6 10*3/uL — ABNORMAL LOW (ref 1.7–7.7)
Neutrophils Relative %: 45 %
Platelet Count: 169 10*3/uL (ref 150–400)
RBC: 5.16 MIL/uL (ref 4.22–5.81)
RDW: 11.5 % (ref 11.5–15.5)
WBC Count: 3.6 10*3/uL — ABNORMAL LOW (ref 4.0–10.5)
nRBC: 0 % (ref 0.0–0.2)

## 2021-05-13 LAB — FERRITIN: Ferritin: 10 ng/mL — ABNORMAL LOW (ref 24–336)

## 2021-05-13 NOTE — Telephone Encounter (Signed)
Called and informed patient of lab results, patient verbalized understanding and denies any questions or concerns at this time.   

## 2021-05-13 NOTE — Progress Notes (Signed)
Hematology and Oncology Follow Up Visit  Chase Roy [id number] [birth year] 35 y.o. 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     Interim History:  Chase Roy is in for follow-up.  He is doing okay.  He did have COVID back in May.  He was not hospitalized.  He had some arthralgias and myalgias.  He missed work for a few days.  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  Hopefully, they will move into their new house in a couple weeks.  It sounds like they did have a baby girl back in February.  His iron studies back in February showed a ferritin of 10 with iron saturation of 52%.  He is donating blood a couple times a year.  He is doing a great job help in the community.  He has had no nausea or vomiting.  There is been no change in bowel or bladder habits.  He has had no leg swelling.  He has had no rashes.  Overall, his performance status is ECOG 0.     Medications:  Current Outpatient Medications:    Ibuprofen (MOTRIN PO), Take by mouth as needed., Disp: , Rfl:    lisinopril (ZESTRIL) 2.5 MG tablet, Take 1 tablet (2.5 mg total) by mouth daily., Disp: 90 tablet, Rfl: 3   loratadine (CLARITIN) 10 MG tablet, Take 10 mg by mouth daily., Disp: , Rfl:   Allergies: No Known Allergies  Past Medical History, Surgical history, Social history, and Family History were reviewed and updated.  Review of Systems: Review of Systems  Constitutional: Negative.   HENT:  Negative.    Eyes: Negative.   Respiratory: Negative.    Cardiovascular: Negative.   Gastrointestinal: Negative.   Endocrine: Negative.   Genitourinary: Negative.    Musculoskeletal: Negative.   Skin: Negative.   Neurological: Negative.   Hematological: Negative.   Psychiatric/Behavioral: Negative.     Physical Exam:  vitals were not taken for this visit.   Wt Readings from Last  3 Encounters:  12/11/20 170 lb 0.6 oz (77.1 kg)  09/12/20 168 lb (76.2 kg)  06/30/20 166 lb (75.3 kg)    Physical Exam Vitals reviewed.  HENT:     Head: Normocephalic and atraumatic.  Eyes:     Pupils: Pupils are equal, round, and reactive to light.  Cardiovascular:     Rate and Rhythm: Normal rate and regular rhythm.     Heart sounds: Normal heart sounds.  Pulmonary:     Effort: Pulmonary effort is normal.     Breath sounds: Normal breath sounds.  Abdominal:     General: Bowel sounds are normal.     Palpations: Abdomen is soft.  Musculoskeletal:        General: No tenderness or deformity. Normal range of motion.     Cervical back: Normal range of motion.  Lymphadenopathy:     Cervical: No cervical adenopathy.  Skin:    General: Skin is warm and dry.     Findings: No erythema or rash.  Neurological:     Mental Status: He is alert and oriented to person, place, and time.  Psychiatric:        Behavior: Behavior normal.        Thought Content: Thought content normal.        Judgment: Judgment normal.     Lab  Results  Component Value Date   WBC 3.6 (L) 05/13/2021   HGB 16.2 05/13/2021   HCT 45.3 05/13/2021   MCV 87.8 05/13/2021   PLT 169 05/13/2021     Chemistry      Component Value Date/Time   NA 136 12/11/2020 0818   K 4.1 12/11/2020 0818   CL 99 12/11/2020 0818   CO2 33 (H) 12/11/2020 0818   BUN 15 12/11/2020 0818   CREATININE 1.12 12/11/2020 0818   CREATININE 1.05 09/12/2020 0840      Component Value Date/Time   CALCIUM 10.0 12/11/2020 0818   ALKPHOS 71 12/11/2020 0818   AST 23 12/11/2020 0818   ALT 33 12/11/2020 0818   BILITOT 0.6 12/11/2020 0818      Impression and Plan: Chase Roy is a 35 year old white male.  He has hemochromatosis.  He is homozygous for the major C282Y mutation.  We will see was iron studies are.  Again he is donating blood twice a year.  He gets around donate blood more often if he would like.  His hemoglobin and hematocrit  are fantastic.   He has O+ blood type which I think is very helpful.  I think we can still get him back in 6 months.  Hopefully he will be able to donate blood every couple months.   [name], MD 7/6/20228:04 AM

## 2021-05-13 NOTE — Telephone Encounter (Signed)
-----   Message from Chase Macho, MD sent at 05/13/2021 11:46 AM EDT ----- [name] call and tell him that the iron studies were fantastic.  The ferritin is 10.  Iron saturation is 28%.  Tell him to keep on donating  blood.  I would probably donate 3 or 4 times a year.  [name]

## 2021-09-09 ENCOUNTER — Other Ambulatory Visit: Payer: Self-pay | Admitting: Adult Health

## 2021-09-09 DIAGNOSIS — I10 Essential (primary) hypertension: Secondary | ICD-10-CM

## 2021-09-09 NOTE — Telephone Encounter (Signed)
Pt is due for CPE 

## 2021-09-27 ENCOUNTER — Other Ambulatory Visit: Payer: Self-pay | Admitting: Adult Health

## 2021-09-27 DIAGNOSIS — I10 Essential (primary) hypertension: Secondary | ICD-10-CM

## 2021-09-28 NOTE — Telephone Encounter (Signed)
Pt due for CPE

## 2021-09-30 NOTE — Telephone Encounter (Signed)
Pt has cpe sch for 10-23-2021 at 7 am

## 2021-10-23 ENCOUNTER — Ambulatory Visit (INDEPENDENT_AMBULATORY_CARE_PROVIDER_SITE_OTHER): Payer: 59 | Admitting: Adult Health

## 2021-10-23 ENCOUNTER — Encounter: Payer: Self-pay | Admitting: Adult Health

## 2021-10-23 VITALS — BP 120/84 | HR 72 | Temp 97.0°F | Ht 67.5 in | Wt 169.0 lb

## 2021-10-23 DIAGNOSIS — I10 Essential (primary) hypertension: Secondary | ICD-10-CM

## 2021-10-23 DIAGNOSIS — Z0000 Encounter for general adult medical examination without abnormal findings: Secondary | ICD-10-CM

## 2021-10-23 LAB — CBC WITH DIFFERENTIAL/PLATELET
Basophils Absolute: 0.2 10*3/uL — ABNORMAL HIGH (ref 0.0–0.1)
Basophils Relative: 4.1 % — ABNORMAL HIGH (ref 0.0–3.0)
Eosinophils Absolute: 0.2 10*3/uL (ref 0.0–0.7)
Eosinophils Relative: 3.6 % (ref 0.0–5.0)
HCT: 48.1 % (ref 39.0–52.0)
Hemoglobin: 16.3 g/dL (ref 13.0–17.0)
Lymphocytes Relative: 34.7 % (ref 12.0–46.0)
Lymphs Abs: 1.5 10*3/uL (ref 0.7–4.0)
MCHC: 33.8 g/dL (ref 30.0–36.0)
MCV: 87 fl (ref 78.0–100.0)
Monocytes Absolute: 0.5 10*3/uL (ref 0.1–1.0)
Monocytes Relative: 10.8 % (ref 3.0–12.0)
Neutro Abs: 2 10*3/uL (ref 1.4–7.7)
Neutrophils Relative %: 46.8 % (ref 43.0–77.0)
Platelets: 152 10*3/uL (ref 150.0–400.0)
RBC: 5.53 Mil/uL (ref 4.22–5.81)
RDW: 13.4 % (ref 11.5–15.5)
WBC: 4.3 10*3/uL (ref 4.0–10.5)

## 2021-10-23 LAB — COMPREHENSIVE METABOLIC PANEL
ALT: 38 U/L (ref 0–53)
AST: 26 U/L (ref 0–37)
Albumin: 4.5 g/dL (ref 3.5–5.2)
Alkaline Phosphatase: 79 U/L (ref 39–117)
BUN: 16 mg/dL (ref 6–23)
CO2: 29 mEq/L (ref 19–32)
Calcium: 9.8 mg/dL (ref 8.4–10.5)
Chloride: 101 mEq/L (ref 96–112)
Creatinine, Ser: 1.1 mg/dL (ref 0.40–1.50)
GFR: 86.4 mL/min (ref >60.00)
Glucose, Bld: 85 mg/dL (ref 70–99)
Potassium: 4 mEq/L (ref 3.5–5.1)
Sodium: 137 mEq/L (ref 135–145)
Total Bilirubin: 0.7 mg/dL (ref 0.2–1.2)
Total Protein: 7.5 g/dL (ref 6.0–8.3)

## 2021-10-23 LAB — LIPID PANEL
Cholesterol: 215 mg/dL — ABNORMAL HIGH (ref 0–200)
HDL: 67.9 mg/dL (ref >39.00)
LDL Cholesterol: 127 mg/dL — ABNORMAL HIGH (ref 0–99)
NonHDL: 147.19
Total CHOL/HDL Ratio: 3
Triglycerides: 99 mg/dL (ref 0.0–149.0)
VLDL: 19.8 mg/dL (ref 0.0–40.0)

## 2021-10-23 LAB — HEMOGLOBIN A1C: Hgb A1c MFr Bld: 5.6 % (ref 4.6–6.5)

## 2021-10-23 LAB — TSH: TSH: 1.58 u[IU]/mL (ref 0.35–5.50)

## 2021-10-23 MED ORDER — LISINOPRIL 2.5 MG PO TABS: 2.5000 mg | ORAL_TABLET | Freq: Every day | ORAL | 3 refills | Status: DC

## 2021-10-23 NOTE — Patient Instructions (Signed)
Health Maintenance Due  Topic Date Due   Hepatitis C Screening  Never done   COVID-19 Vaccine (3 - Booster for Pfizer series) 09/18/2020   INFLUENZA VACCINE  06/08/2021    Depression screen [facility] 2/9 10/23/2021 09/12/2020 09/12/2014  Decreased Interest 0 0 0  Down, Depressed, Hopeless 0 0 0  PHQ - 2 Score 0 0 0  Altered sleeping 0 - -  Tired, decreased energy 0 - -  Change in appetite 0 - -  Feeling bad or failure about yourself  0 - -  Trouble concentrating 0 - -  Moving slowly or fidgety/restless 0 - -  Suicidal thoughts 0 - -  PHQ-9 Score 0 - -  Difficult doing work/chores Not difficult at all - -

## 2021-10-23 NOTE — Progress Notes (Signed)
Subjective:    Patient ID: Chase Roy, male    DOB: 29-Oct-1986, 35 y.o.   MRN: [id number]  HPI  Patient presents for yearly preventative medicine examination. He is a pleasant 35 year old male who  has a past medical history of Chicken pox and Hemochromatosis.  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 Has a 30 month old and build a new house over the year.   Hemochromatosis-followed by oncology, Dr. Myna Hidalgo.  Does therapeutic phlebotomy 2-3 times a year at the [facility].  Denies issues with nausea or vomitin  Essential hypertension-is prescribed lisinopril 2.5 mg daily.  Denies headaches, blurred vision, chest pain, shortness of breath, or syncopal episodes .Checks BP at home periodically and reports readings in the 120/80's. At work his BP goes up into the 140/80-90's.  BP Readings from Last 3 Encounters:  10/23/21 120/84  05/13/21 (!) 151/83  12/11/20 (!) 149/85   All immunizations and health maintenance protocols were reviewed with the patient and needed orders were placed.  Appropriate screening laboratory values were ordered for the patient including screening of hyperlipidemia, renal function and hepatic function.  Medication reconciliation,  past medical history, social history, problem list and allergies were reviewed in detail with the patient  Goals were established with regard to weight loss, exercise, and  diet in compliance with medications Wt Readings from Last 3 Encounters:  10/23/21 169 lb (76.7 kg)  05/13/21 167 lb (75.8 kg)  12/11/20 170 lb 0.6 oz (77.1 kg)   Review of Systems  Constitutional: Negative.   HENT: Negative.    Eyes: Negative.   Respiratory: Negative.    Cardiovascular: Negative.   Gastrointestinal: Negative.   Endocrine: Negative.   Genitourinary: Negative.   Musculoskeletal: Negative.   Skin: Negative.   Allergic/Immunologic: Negative.   Neurological: Negative.   Hematological: Negative.   Psychiatric/Behavioral:  Negative.    All other systems reviewed and are negative.  Past Medical History:  Diagnosis Date   Chicken pox    Hemochromatosis     Social History   Socioeconomic History   Marital status: Married    Spouse name: Not on file   Number of children: Not on file   Years of education: Not on file   Highest education level: Not on file  Occupational History   Not on file  Tobacco Use   Smoking status: Former    Types: Cigarettes    Quit date: 11/02/2006    Years since quitting: 14.9   Smokeless tobacco: Former    Types: Snuff  Substance and Sexual Activity   Alcohol use: Yes   Drug use: No   Sexual activity: Yes  Other Topics Concern   Not on file  Social History Narrative   Not on file   Social Determinants of Health   Financial Resource Strain: Not on file  Food Insecurity: Not on file  Transportation Needs: Not on file  Physical Activity: Not on file  Stress: Not on file  Social Connections: Not on file  Intimate Partner Violence: Not on file    Past Surgical History:  Procedure Laterality Date   HAND SURGERY     KNEE SURGERY     TONSILLECTOMY  1990    Family History  Problem Relation Age of Onset   Hypertension Mother    Hypertension Father    Hemochromatosis Sister    Anemia Sister     No Known Allergies  Current Outpatient Medications on File Prior to  Visit  Medication Sig Dispense Refill   Ibuprofen (MOTRIN PO) Take by mouth as needed.     loratadine (CLARITIN) 10 MG tablet Take 10 mg by mouth daily.     No current facility-administered medications on file prior to visit.    BP 120/84    Pulse 72    Temp (!) 97 F (36.1 C) (Temporal)    Ht 5' 7.5" (1.715 m)    Wt 169 lb (76.7 kg)    SpO2 98%    BMI 26.08 kg/m        Objective:   Physical Exam Vitals and nursing note reviewed.  Constitutional:      General: He is not in acute distress.    Appearance: Normal appearance. He is well-developed and normal weight.  HENT:     Head:  Normocephalic and atraumatic.     Right Ear: Tympanic membrane, ear canal and external ear normal. There is no impacted cerumen.     Left Ear: Tympanic membrane, ear canal and external ear normal. There is no impacted cerumen.     Nose: Nose normal. No congestion or rhinorrhea.     Mouth/Throat:     Mouth: Mucous membranes are moist.     Pharynx: Oropharynx is clear. No oropharyngeal exudate or posterior oropharyngeal erythema.  Eyes:     General:        Right eye: No discharge.        Left eye: No discharge.     Extraocular Movements: Extraocular movements intact.     Conjunctiva/sclera: Conjunctivae normal.     Pupils: Pupils are equal, round, and reactive to light.  Neck:     Vascular: No carotid bruit.     Trachea: No tracheal deviation.  Cardiovascular:     Rate and Rhythm: Normal rate and regular rhythm.     Pulses: Normal pulses.     Heart sounds: Normal heart sounds. No murmur heard.   No friction rub. No gallop.  Pulmonary:     Effort: Pulmonary effort is normal. No respiratory distress.     Breath sounds: Normal breath sounds. No stridor. No wheezing, rhonchi or rales.  Chest:     Chest wall: No tenderness.  Abdominal:     General: Bowel sounds are normal. There is no distension.     Palpations: Abdomen is soft. There is no mass.     Tenderness: There is no abdominal tenderness. There is no right CVA tenderness, left CVA tenderness, guarding or rebound.     Hernia: No hernia is present.  Musculoskeletal:        General: No swelling, tenderness, deformity or signs of injury. Normal range of motion.     Right lower leg: No edema.     Left lower leg: No edema.  Lymphadenopathy:     Cervical: No cervical adenopathy.  Skin:    General: Skin is warm and dry.     Capillary Refill: Capillary refill takes less than 2 seconds.     Coloration: Skin is not jaundiced or pale.     Findings: No bruising, erythema, lesion or rash.  Neurological:     General: No focal deficit  present.     Mental Status: He is alert and oriented to person, place, and time.     Cranial Nerves: No cranial nerve deficit.     Sensory: No sensory deficit.     Motor: No weakness.     Coordination: Coordination normal.     Gait: Gait normal.  Deep Tendon Reflexes: Reflexes normal.  Psychiatric:        Mood and Affect: Mood normal.        Behavior: Behavior normal.        Thought Content: Thought content normal.        Judgment: Judgment normal.       Assessment & Plan:  1. Routine general medical examination at a health care facility  - CBC with Differential/Platelet; Future - Comprehensive metabolic panel; Future - Hemoglobin A1c; Future - Lipid panel; Future - TSH; Future - lisinopril (ZESTRIL) 2.5 MG tablet; Take 1 tablet (2.5 mg total) by mouth daily.  Dispense: 90 tablet; Refill: 3  2. Hereditary hemochromatosis (HCC)  - CBC with Differential/Platelet; Future - Comprehensive metabolic panel; Future - Hemoglobin A1c; Future - Lipid panel; Future - TSH; Future  3. Essential hypertension - No change. Will have him check more frequently at work and home. He will send me a Clinical cytogeneticist message with concerns.  - CBC with Differential/Platelet; Future - Comprehensive metabolic panel; Future - Hemoglobin A1c; Future - Lipid panel; Future - TSH; Future - lisinopril (ZESTRIL) 2.5 MG tablet; Take 1 tablet (2.5 mg total) by mouth daily.  Dispense: 90 tablet; Refill: 3  Shirline Frees, NP

## 2021-11-13 ENCOUNTER — Inpatient Hospital Stay: Payer: 59

## 2021-11-13 ENCOUNTER — Inpatient Hospital Stay: Payer: 59 | Admitting: Hematology & Oncology

## 2021-11-16 ENCOUNTER — Inpatient Hospital Stay: Payer: 59 | Admitting: Hematology & Oncology

## 2021-11-16 ENCOUNTER — Other Ambulatory Visit: Payer: Self-pay

## 2021-11-16 ENCOUNTER — Inpatient Hospital Stay: Payer: 59 | Attending: Hematology & Oncology

## 2021-11-16 ENCOUNTER — Encounter: Payer: Self-pay | Admitting: Hematology & Oncology

## 2021-11-16 LAB — CBC WITH DIFFERENTIAL (CANCER CENTER ONLY)
Abs Immature Granulocytes: 0.05 10*3/uL (ref 0.00–0.07)
Basophils Absolute: 0 10*3/uL (ref 0.0–0.1)
Basophils Relative: 1 %
Eosinophils Absolute: 0.3 10*3/uL (ref 0.0–0.5)
Eosinophils Relative: 4 %
HCT: 45.9 % (ref 39.0–52.0)
Hemoglobin: 16 g/dL (ref 13.0–17.0)
Immature Granulocytes: 1 %
Lymphocytes Relative: 22 %
Lymphs Abs: 1.4 10*3/uL (ref 0.7–4.0)
MCH: 29.9 pg (ref 26.0–34.0)
MCHC: 34.9 g/dL (ref 30.0–36.0)
MCV: 85.8 fL (ref 80.0–100.0)
Monocytes Absolute: 0.6 10*3/uL (ref 0.1–1.0)
Monocytes Relative: 10 %
Neutro Abs: 3.9 10*3/uL (ref 1.7–7.7)
Neutrophils Relative %: 62 %
Platelet Count: 167 10*3/uL (ref 150–400)
RBC: 5.35 MIL/uL (ref 4.22–5.81)
RDW: 12.4 % (ref 11.5–15.5)
WBC Count: 6.2 10*3/uL (ref 4.0–10.5)
nRBC: 0 % (ref 0.0–0.2)

## 2021-11-16 LAB — RETICULOCYTES
Immature Retic Fract: 5.9 % (ref 2.3–15.9)
RBC.: 5.34 MIL/uL (ref 4.22–5.81)
Retic Count, Absolute: 74.2 10*3/uL (ref 19.0–186.0)
Retic Ct Pct: 1.4 % (ref 0.4–3.1)

## 2021-11-16 LAB — CMP (CANCER CENTER ONLY)
ALT: 39 U/L (ref 0–44)
AST: 25 U/L (ref 15–41)
Albumin: 4.6 g/dL (ref 3.5–5.0)
Alkaline Phosphatase: 81 U/L (ref 38–126)
Anion gap: 8 (ref 5–15)
BUN: 14 mg/dL (ref 6–20)
CO2: 29 mmol/L (ref 22–32)
Calcium: 10.1 mg/dL (ref 8.9–10.3)
Chloride: 102 mmol/L (ref 98–111)
Creatinine: 1.18 mg/dL (ref 0.61–1.24)
GFR, Estimated: >60 mL/min (ref >60)
Glucose, Bld: 76 mg/dL (ref 70–99)
Potassium: 3.7 mmol/L (ref 3.5–5.1)
Sodium: 139 mmol/L (ref 135–145)
Total Bilirubin: 0.6 mg/dL (ref 0.3–1.2)
Total Protein: 7 g/dL (ref 6.5–8.1)

## 2021-11-16 LAB — IRON AND IRON BINDING CAPACITY (CC-WL,HP ONLY)
Iron: 120 ug/dL (ref 45–182)
Saturation Ratios: 37 % (ref 17.9–39.5)
TIBC: 329 ug/dL (ref 250–450)
UIBC: 209 ug/dL (ref 117–376)

## 2021-11-16 NOTE — Progress Notes (Signed)
Hematology and Oncology Follow Up Visit  Chase Roy [id number] 10/10/1986 36 y.o. 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     Interim History:  Chase Roy is in for follow-up.  We saw him 6 months ago.  He has been quite busy.  He works Educational psychologist.  He actually had a call I think over the weekend locally.  Hey lady in a grocery store had a heart attack.  He had no problems over the holidays.  As always, he and his wife are quite busy.  They have a new house.  They moved into the house.  They are enjoying this.  His last iron studies that were done back in July showed a ferritin of only 10 with an iron saturation of 28%.  Everything said he last donated blood back in the Fall.  There is no fever.  He has had no issues with COVID since he had back in May.  He has had no change in bowel or bladder habits.  He has had no rashes.  There has been no leg swelling.  He has had no cough or shortness of breath.  There is been no issues with headaches.  Overall, I would say his performance status is probably ECOG 0.   Medications:  Current Outpatient Medications:    Ibuprofen (MOTRIN PO), Take by mouth as needed., Disp: , Rfl:    lisinopril (ZESTRIL) 2.5 MG tablet, Take 1 tablet (2.5 mg total) by mouth daily., Disp: 90 tablet, Rfl: 3   loratadine (CLARITIN) 10 MG tablet, Take 10 mg by mouth daily., Disp: , Rfl:   Allergies: No Known Allergies  Past Medical History, Surgical history, Social history, and Family History were reviewed and updated.  Review of Systems: Review of Systems  Constitutional: Negative.   HENT:  Negative.    Eyes: Negative.   Respiratory: Negative.    Cardiovascular: Negative.   Gastrointestinal: Negative.   Endocrine: Negative.   Genitourinary: Negative.    Musculoskeletal: Negative.   Skin: Negative.   Neurological: Negative.    Hematological: Negative.   Psychiatric/Behavioral: Negative.     Physical Exam:  weight is 169 lb (76.7 kg). His oral temperature is 98.2 F (36.8 C). His blood pressure is 113/83 and his pulse is 93. His respiration is 16 and oxygen saturation is 97%.   Wt Readings from Last 3 Encounters:  11/16/21 169 lb (76.7 kg)  10/23/21 169 lb (76.7 kg)  05/13/21 167 lb (75.8 kg)    Physical Exam Vitals reviewed.  HENT:     Head: Normocephalic and atraumatic.  Eyes:     Pupils: Pupils are equal, round, and reactive to light.  Cardiovascular:     Rate and Rhythm: Normal rate and regular rhythm.     Heart sounds: Normal heart sounds.  Pulmonary:     Effort: Pulmonary effort is normal.     Breath sounds: Normal breath sounds.  Abdominal:     General: Bowel sounds are normal.     Palpations: Abdomen is soft.  Musculoskeletal:        General: No tenderness or deformity. Normal range of motion.     Cervical back: Normal range of motion.  Lymphadenopathy:     Cervical: No cervical adenopathy.  Skin:    General: Skin is warm and dry.     Findings: No erythema or rash.  Neurological:  Mental Status: He is alert and oriented to person, place, and time.  Psychiatric:        Behavior: Behavior normal.        Thought Content: Thought content normal.        Judgment: Judgment normal.     Lab Results  Component Value Date   WBC 6.2 11/16/2021   HGB 16.0 11/16/2021   HCT 45.9 11/16/2021   MCV 85.8 11/16/2021   PLT 167 11/16/2021     Chemistry      Component Value Date/Time   NA 139 11/16/2021 1152   K 3.7 11/16/2021 1152   CL 102 11/16/2021 1152   CO2 29 11/16/2021 1152   BUN 14 11/16/2021 1152   CREATININE 1.18 11/16/2021 1152   CREATININE 1.05 09/12/2020 0840      Component Value Date/Time   CALCIUM 10.1 11/16/2021 1152   ALKPHOS 81 11/16/2021 1152   AST 25 11/16/2021 1152   ALT 39 11/16/2021 1152   BILITOT 0.6 11/16/2021 1152      Impression and Plan: [name] is a 36 year old white male.  He has hemochromatosis.  He is homozygous for the major C282Y mutation.  We will see was iron studies are.  I have to believe that the iron studies will be okay.  We will see him back in 6 more months.  I think that as long as he donates blood, everything will be fine with his hemochromatosis.  I did tell him that he could have steak.  I told him that this is not going to affect his iron.  He was happy to hear this.     [name], MD 1/9/20231:12 PM

## 2021-11-17 ENCOUNTER — Encounter: Payer: Self-pay | Admitting: *Deleted

## 2021-11-17 LAB — FERRITIN: Ferritin: 17 ng/mL — ABNORMAL LOW (ref 24–336)

## 2022-01-11 ENCOUNTER — Ambulatory Visit: Payer: 59 | Admitting: Family Medicine

## 2022-01-11 VITALS — BP 128/88 | HR 61 | Temp 97.7°F | Wt 168.0 lb

## 2022-01-11 DIAGNOSIS — J019 Acute sinusitis, unspecified: Secondary | ICD-10-CM | POA: Diagnosis not present

## 2022-01-11 MED ORDER — AMOXICILLIN-POT CLAVULANATE 875-125 MG PO TABS: 1.0000 | ORAL_TABLET | Freq: Two times a day (BID) | ORAL | 0 refills | Status: DC

## 2022-01-11 NOTE — Progress Notes (Signed)
? ?  Subjective:  ? ? Patient ID: Chase Roy, male    DOB: [birth year], 36 y.o.   MRN: [id number] ? ?HPI ?Here for 10 days of sinus pressure, PND, bilateral ear pain, ST and a dry cough. He had a low grade fever the first day but none since. No body aches. He is taking Mucinex. He tested negative for the Covid virus.  ? ? ?Review of Systems  ?Constitutional: Negative.   ?HENT:  Positive for congestion, ear pain, postnasal drip, sinus pressure and sore throat.   ?Eyes: Negative.   ?Respiratory:  Positive for cough. Negative for shortness of breath and wheezing.   ?Gastrointestinal: Negative.   ? ?   ?Objective:  ? Physical Exam ?Constitutional:   ?   Appearance: Normal appearance.  ?HENT:  ?   Right Ear: Tympanic membrane, ear canal and external ear normal.  ?   Left Ear: Tympanic membrane, ear canal and external ear normal.  ?   Nose: Nose normal.  ?   Mouth/Throat:  ?   Pharynx: Oropharynx is clear.  ?Eyes:  ?   Conjunctiva/sclera: Conjunctivae normal.  ?Pulmonary:  ?   Effort: Pulmonary effort is normal.  ?   Breath sounds: Normal breath sounds.  ?Lymphadenopathy:  ?   Cervical: No cervical adenopathy.  ?Neurological:  ?   Mental Status: He is alert.  ? ? ? ? ? ?   ?Assessment & Plan:  ?Sinusitis, treat with Augmentin. Recheck as needed.  ?Gershon Crane, MD ? ? ?

## 2022-05-17 ENCOUNTER — Inpatient Hospital Stay: Payer: 59 | Admitting: Hematology & Oncology

## 2022-05-17 ENCOUNTER — Other Ambulatory Visit: Payer: Self-pay

## 2022-05-17 ENCOUNTER — Encounter: Payer: Self-pay | Admitting: Hematology & Oncology

## 2022-05-17 ENCOUNTER — Inpatient Hospital Stay: Payer: 59 | Attending: Hematology & Oncology

## 2022-05-17 DIAGNOSIS — Z79899 Other long term (current) drug therapy: Secondary | ICD-10-CM | POA: Insufficient documentation

## 2022-05-17 LAB — CMP (CANCER CENTER ONLY)
ALT: 29 U/L (ref 0–44)
AST: 30 U/L (ref 15–41)
Albumin: 4.1 g/dL (ref 3.5–5.0)
Alkaline Phosphatase: 87 U/L (ref 38–126)
Anion gap: 10 (ref 5–15)
BUN: 17 mg/dL (ref 6–20)
CO2: 24 mmol/L (ref 22–32)
Calcium: 9.1 mg/dL (ref 8.9–10.3)
Chloride: 103 mmol/L (ref 98–111)
Creatinine: 1.15 mg/dL (ref 0.61–1.24)
GFR, Estimated: >60 mL/min (ref >60)
Glucose, Bld: 116 mg/dL — ABNORMAL HIGH (ref 70–99)
Potassium: 3.6 mmol/L (ref 3.5–5.1)
Sodium: 137 mmol/L (ref 135–145)
Total Bilirubin: 0.4 mg/dL (ref 0.3–1.2)
Total Protein: 7.2 g/dL (ref 6.5–8.1)

## 2022-05-17 LAB — CBC WITH DIFFERENTIAL (CANCER CENTER ONLY)
Abs Immature Granulocytes: 0 10*3/uL (ref 0.00–0.07)
Basophils Absolute: 0 10*3/uL (ref 0.0–0.1)
Basophils Relative: 1 %
Eosinophils Absolute: 0.3 10*3/uL (ref 0.0–0.5)
Eosinophils Relative: 5 %
HCT: 43.1 % (ref 39.0–52.0)
Hemoglobin: 14.7 g/dL (ref 13.0–17.0)
Immature Granulocytes: 0 %
Lymphocytes Relative: 40 %
Lymphs Abs: 2 10*3/uL (ref 0.7–4.0)
MCH: 29.5 pg (ref 26.0–34.0)
MCHC: 34.1 g/dL (ref 30.0–36.0)
MCV: 86.4 fL (ref 80.0–100.0)
Monocytes Absolute: 0.3 10*3/uL (ref 0.1–1.0)
Monocytes Relative: 6 %
Neutro Abs: 2.3 10*3/uL (ref 1.7–7.7)
Neutrophils Relative %: 48 %
Platelet Count: 168 10*3/uL (ref 150–400)
RBC: 4.99 MIL/uL (ref 4.22–5.81)
RDW: 12 % (ref 11.5–15.5)
WBC Count: 4.9 10*3/uL (ref 4.0–10.5)
nRBC: 0 % (ref 0.0–0.2)

## 2022-05-17 LAB — FERRITIN: Ferritin: 6 ng/mL — ABNORMAL LOW (ref 24–336)

## 2022-05-17 NOTE — Progress Notes (Signed)
Hematology and Oncology Follow Up Visit  Chase Roy [id number] Jun 25, 1986 36 y.o. 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     Interim History:  Chase Roy is in for follow-up.  Overall, he is looking good.  Is doing good.  He is still working as A EMS.  He and his family just got back from vacation.  That a good time on vacation at the beach.  He had a birthday last week.  He had a great birthday present.  He actually positive plane.  I thought this was a wonderful and unique gift.  He and his family will be going on a [employer] later in the summer.  His last iron studies that were done back in January showed a ferritin of 17 with an iron saturation of 37%.  He said he last donated blood back in April.  He has had no problems with nausea or vomiting.  He has had no problems with cough or shortness of breath.  There is no change in bowel or bladder habits.  Overall, I would say his performance status is probably ECOG 0.     Medications:  Current Outpatient Medications:    Ibuprofen (MOTRIN PO), Take by mouth as needed., Disp: , Rfl:    lisinopril (ZESTRIL) 2.5 MG tablet, Take 1 tablet (2.5 mg total) by mouth daily., Disp: 90 tablet, Rfl: 3   loratadine (CLARITIN) 10 MG tablet, Take 10 mg by mouth daily., Disp: , Rfl:   Allergies: No Known Allergies  Past Medical History, Surgical history, Social history, and Family History were reviewed and updated.  Review of Systems: Review of Systems  Constitutional: Negative.   HENT:  Negative.    Eyes: Negative.   Respiratory: Negative.    Cardiovascular: Negative.   Gastrointestinal: Negative.   Endocrine: Negative.   Genitourinary: Negative.    Musculoskeletal: Negative.   Skin: Negative.   Neurological: Negative.   Hematological: Negative.   Psychiatric/Behavioral: Negative.       Physical Exam:  weight is 167 lb (75.8 kg). His oral temperature is 97.9 F (36.6 C). His blood pressure is 132/92 (abnormal) and his pulse is 73. His respiration is 17 and oxygen saturation is 98%.   Wt Readings from Last 3 Encounters:  05/17/22 167 lb (75.8 kg)  01/11/22 168 lb (76.2 kg)  11/16/21 169 lb (76.7 kg)    Physical Exam Vitals reviewed.  HENT:     Head: Normocephalic and atraumatic.  Eyes:     Pupils: Pupils are equal, round, and reactive to light.  Cardiovascular:     Rate and Rhythm: Normal rate and regular rhythm.     Heart sounds: Normal heart sounds.  Pulmonary:     Effort: Pulmonary effort is normal.     Breath sounds: Normal breath sounds.  Abdominal:     General: Bowel sounds are normal.     Palpations: Abdomen is soft.  Musculoskeletal:        General: No tenderness or deformity. Normal range of motion.     Cervical back: Normal range of motion.  Lymphadenopathy:     Cervical: No cervical adenopathy.  Skin:    General: Skin is warm and dry.     Findings: No erythema or rash.  Neurological:     Mental Status: He is alert and oriented to person, place, and time.  Psychiatric:  Behavior: Behavior normal.        Thought Content: Thought content normal.        Judgment: Judgment normal.     Lab Results  Component Value Date   WBC 4.9 05/17/2022   HGB 14.7 05/17/2022   HCT 43.1 05/17/2022   MCV 86.4 05/17/2022   PLT 168 05/17/2022     Chemistry      Component Value Date/Time   NA 139 11/16/2021 1152   K 3.7 11/16/2021 1152   CL 102 11/16/2021 1152   CO2 29 11/16/2021 1152   BUN 14 11/16/2021 1152   CREATININE 1.18 11/16/2021 1152   CREATININE 1.05 09/12/2020 0840      Component Value Date/Time   CALCIUM 10.1 11/16/2021 1152   ALKPHOS 81 11/16/2021 1152   AST 25 11/16/2021 1152   ALT 39 11/16/2021 1152   BILITOT 0.6 11/16/2021 1152      Impression and Plan: Chase Roy is a 36 year old white male.  He has  hemochromatosis.  He is homozygous for the major C282Y mutation.  We will see was iron studies are.  I have to believe that the iron studies will be okay.  I told him that he can still donate blood a couple times a year.  I think this is a reasonable timeframe for him.  We will plan to get him back in 6 more months.  We will work to get him back after the holidays.  Chase Macho, MD 7/10/20231:56 PM

## 2022-05-18 ENCOUNTER — Telehealth: Payer: Self-pay

## 2022-05-18 LAB — IRON AND IRON BINDING CAPACITY (CC-WL,HP ONLY)
Iron: 59 ug/dL (ref 45–182)
Saturation Ratios: 17 % — ABNORMAL LOW (ref 17.9–39.5)
TIBC: 349 ug/dL (ref 250–450)
UIBC: 290 ug/dL (ref 117–376)

## 2022-05-18 NOTE — Telephone Encounter (Signed)
-----   Message from [name], MD sent at 05/18/2022 12:15 PM EDT ----- Call and let her know that the iron levels are really nice and low.  Giving donations to the [facility] are doing the job for him.  Everything is going so well.  [name]

## 2022-10-08 ENCOUNTER — Other Ambulatory Visit: Payer: Self-pay | Admitting: Adult Health

## 2022-10-08 DIAGNOSIS — Z0000 Encounter for general adult medical examination without abnormal findings: Secondary | ICD-10-CM

## 2022-10-08 DIAGNOSIS — I10 Essential (primary) hypertension: Secondary | ICD-10-CM

## 2022-10-27 ENCOUNTER — Ambulatory Visit (INDEPENDENT_AMBULATORY_CARE_PROVIDER_SITE_OTHER): Payer: 59 | Admitting: Adult Health

## 2022-10-27 ENCOUNTER — Encounter: Payer: 59 | Admitting: Adult Health

## 2022-10-27 VITALS — BP 120/86 | HR 76 | Temp 98.0°F | Ht 67.75 in | Wt 164.0 lb

## 2022-10-27 DIAGNOSIS — Z0000 Encounter for general adult medical examination without abnormal findings: Secondary | ICD-10-CM | POA: Diagnosis not present

## 2022-10-27 DIAGNOSIS — Z1159 Encounter for screening for other viral diseases: Secondary | ICD-10-CM

## 2022-10-27 DIAGNOSIS — I10 Essential (primary) hypertension: Secondary | ICD-10-CM | POA: Diagnosis not present

## 2022-10-27 LAB — CBC WITH DIFFERENTIAL/PLATELET
Basophils Absolute: 0 10*3/uL (ref 0.0–0.1)
Basophils Relative: 1 % (ref 0.0–3.0)
Eosinophils Absolute: 0.2 10*3/uL (ref 0.0–0.7)
Eosinophils Relative: 4.5 % (ref 0.0–5.0)
HCT: 48.3 % (ref 39.0–52.0)
Hemoglobin: 16.8 g/dL (ref 13.0–17.0)
Lymphocytes Relative: 48.8 % — ABNORMAL HIGH (ref 12.0–46.0)
Lymphs Abs: 2.1 10*3/uL (ref 0.7–4.0)
MCHC: 34.8 g/dL (ref 30.0–36.0)
MCV: 89.2 fl (ref 78.0–100.0)
Monocytes Absolute: 0.4 10*3/uL (ref 0.1–1.0)
Monocytes Relative: 9.1 % (ref 3.0–12.0)
Neutro Abs: 1.5 10*3/uL (ref 1.4–7.7)
Neutrophils Relative %: 36.6 % — ABNORMAL LOW (ref 43.0–77.0)
Platelets: 177 10*3/uL (ref 150.0–400.0)
RBC: 5.41 Mil/uL (ref 4.22–5.81)
RDW: 12.7 % (ref 11.5–15.5)
WBC: 4.2 10*3/uL (ref 4.0–10.5)

## 2022-10-27 LAB — COMPREHENSIVE METABOLIC PANEL
ALT: 31 U/L (ref 0–53)
AST: 25 U/L (ref 0–37)
Albumin: 4.6 g/dL (ref 3.5–5.2)
Alkaline Phosphatase: 84 U/L (ref 39–117)
BUN: 17 mg/dL (ref 6–23)
CO2: 31 mEq/L (ref 19–32)
Calcium: 10 mg/dL (ref 8.4–10.5)
Chloride: 100 mEq/L (ref 96–112)
Creatinine, Ser: 1.09 mg/dL (ref 0.40–1.50)
GFR: 86.73 mL/min (ref >60.00)
Glucose, Bld: 88 mg/dL (ref 70–99)
Potassium: 4.2 mEq/L (ref 3.5–5.1)
Sodium: 138 mEq/L (ref 135–145)
Total Bilirubin: 0.6 mg/dL (ref 0.2–1.2)
Total Protein: 7.9 g/dL (ref 6.0–8.3)

## 2022-10-27 LAB — TSH: TSH: 1.1 u[IU]/mL (ref 0.35–5.50)

## 2022-10-27 MED ORDER — LISINOPRIL 2.5 MG PO TABS
2.5000 mg | ORAL_TABLET | Freq: Every day | ORAL | 3 refills | Status: DC
Start: 2022-10-27 — End: 2023-11-04

## 2022-10-27 NOTE — Patient Instructions (Signed)
It was great seeing you today   We will follow up with you regarding your lab work   Please let me know if you need anything   

## 2022-10-27 NOTE — Progress Notes (Signed)
Subjective:    Patient ID: Chase Roy, male    DOB: 09/23/1986, 36 y.o.   MRN: [id number]  HPI Patient presents for yearly preventative medicine examination. He is a pleasant 36 year old male who  has a past medical history of Chicken pox and Hemochromatosis.  Hemochromatosis- followed by  Dr. Myna Hidalgo.  He does do therapeutic phlebotomy 2-3 times a year at the [facility].  Denies any issues with nausea or vomiting  Hypertension-managed with lisinopril 2.5 mg daily.  He denies headaches, blurred vision, chest pain, shortness of breath, or syncopal episodes.  He does check his blood pressure periodically with readings in the 120s over 80s. BP Readings from Last 3 Encounters:  05/17/22 (!) 132/92  01/11/22 128/88  11/16/21 113/83     All immunizations and health maintenance protocols were reviewed with the patient and needed orders were placed.  Appropriate screening laboratory values were ordered for the patient including screening of hyperlipidemia, renal function and hepatic function.  Medication reconciliation,  past medical history, social history, problem list and allergies were reviewed in detail with the patient  Goals were established with regard to weight loss, exercise, and  diet in compliance with medications Wt Readings from Last 3 Encounters:  05/17/22 167 lb (75.8 kg)  01/11/22 168 lb (76.2 kg)  11/16/21 169 lb (76.7 kg)   Review of Systems  Constitutional: Negative.   HENT: Negative.    Eyes: Negative.   Respiratory: Negative.    Cardiovascular: Negative.   Gastrointestinal: Negative.   Endocrine: Negative.   Genitourinary: Negative.   Musculoskeletal: Negative.   Skin: Negative.   Allergic/Immunologic: Negative.   Neurological: Negative.   Hematological: Negative.   Psychiatric/Behavioral: Negative.    All other systems reviewed and are negative.  Past Medical History:  Diagnosis Date   Chicken pox    Hemochromatosis     Social History    Socioeconomic History   Marital status: Married    Spouse name: Not on file   Number of children: Not on file   Years of education: Not on file   Highest education level: Associate degree: academic program  Occupational History   Not on file  Tobacco Use   Smoking status: Former    Types: Cigarettes    Quit date: 11/02/2006    Years since quitting: 15.9   Smokeless tobacco: Former    Types: Snuff  Substance and Sexual Activity   Alcohol use: Yes   Drug use: No   Sexual activity: Yes  Other Topics Concern   Not on file  Social History Narrative   Not on file   Social Determinants of Health   Financial Resource Strain: Low Risk  (01/11/2022)   Overall Financial Resource Strain (CARDIA)    Difficulty of Paying Living Expenses: Not hard at all  Food Insecurity: No Food Insecurity (01/11/2022)   Hunger Vital Sign    Worried About Running Out of Food in the Last Year: Never true    Ran Out of Food in the Last Year: Never true  Transportation Needs: No Transportation Needs (01/11/2022)   PRAPARE - Administrator, Civil Service (Medical): No    Lack of Transportation (Non-Medical): No  Physical Activity: Insufficiently Active (01/11/2022)   Exercise Vital Sign    Days of Exercise per Week: 3 days    Minutes of Exercise per Session: 30 min  Stress: No Stress Concern Present (01/11/2022)   [employer] of Occupational Health - Occupational Stress  Questionnaire    Feeling of Stress : Not at all  Social Connections: Moderately Integrated (01/11/2022)   Social Connection and Isolation Panel [NHANES]    Frequency of Communication with Friends and Family: More than three times a week    Frequency of Social Gatherings with Friends and Family: Once a week    Attends Religious Services: More than 4 times per year    Active Member of [employer] or Organizations: No    Attends Engineer, structural: Not on file    Marital Status: Married  Catering manager Violence: Not on  file    Past Surgical History:  Procedure Laterality Date   HAND SURGERY     KNEE SURGERY     TONSILLECTOMY  1990    Family History  Problem Relation Age of Onset   Hypertension Mother    Hypertension Father    Hemochromatosis Sister    Anemia Sister     No Known Allergies  Current Outpatient Medications on File Prior to Visit  Medication Sig Dispense Refill   Ibuprofen (MOTRIN PO) Take by mouth as needed.     lisinopril (ZESTRIL) 2.5 MG tablet TAKE 1 TABLET BY MOUTH EVERY DAY 30 tablet 11   loratadine (CLARITIN) 10 MG tablet Take 10 mg by mouth daily.     No current facility-administered medications on file prior to visit.    There were no vitals taken for this visit.      Objective:   Physical Exam Vitals and nursing note reviewed.  Constitutional:      General: He is not in acute distress.    Appearance: Normal appearance. He is well-developed and normal weight.  HENT:     Head: Normocephalic and atraumatic.     Right Ear: Tympanic membrane, ear canal and external ear normal. There is no impacted cerumen.     Left Ear: Tympanic membrane, ear canal and external ear normal. There is no impacted cerumen.     Nose: Nose normal. No congestion or rhinorrhea.     Mouth/Throat:     Mouth: Mucous membranes are moist.     Pharynx: Oropharynx is clear. No oropharyngeal exudate or posterior oropharyngeal erythema.  Eyes:     General:        Right eye: No discharge.        Left eye: No discharge.     Extraocular Movements: Extraocular movements intact.     Conjunctiva/sclera: Conjunctivae normal.     Pupils: Pupils are equal, round, and reactive to light.  Neck:     Vascular: No carotid bruit.     Trachea: No tracheal deviation.  Cardiovascular:     Rate and Rhythm: Normal rate and regular rhythm.     Pulses: Normal pulses.     Heart sounds: Normal heart sounds. No murmur heard.    No friction rub. No gallop.  Pulmonary:     Effort: Pulmonary effort is normal. No  respiratory distress.     Breath sounds: Normal breath sounds. No stridor. No wheezing, rhonchi or rales.  Chest:     Chest wall: No tenderness.  Abdominal:     General: Bowel sounds are normal. There is no distension.     Palpations: Abdomen is soft. There is no mass.     Tenderness: There is no abdominal tenderness. There is no right CVA tenderness, left CVA tenderness, guarding or rebound.     Hernia: No hernia is present.  Musculoskeletal:  General: No swelling, tenderness, deformity or signs of injury. Normal range of motion.     Right lower leg: No edema.     Left lower leg: No edema.  Lymphadenopathy:     Cervical: No cervical adenopathy.  Skin:    General: Skin is warm and dry.     Capillary Refill: Capillary refill takes less than 2 seconds.     Coloration: Skin is not jaundiced or pale.     Findings: No bruising, erythema, lesion or rash.  Neurological:     General: No focal deficit present.     Mental Status: He is alert and oriented to person, place, and time.     Cranial Nerves: No cranial nerve deficit.     Sensory: No sensory deficit.     Motor: No weakness.     Coordination: Coordination normal.     Gait: Gait normal.     Deep Tendon Reflexes: Reflexes normal.  Psychiatric:        Mood and Affect: Mood normal.        Behavior: Behavior normal.        Thought Content: Thought content normal.        Judgment: Judgment normal.       Assessment & Plan:  1. Routine general medical examination at a health care facility Today patient counseled on age appropriate routine health concerns for screening and prevention, each reviewed and up to date or declined. Immunizations reviewed and up to date or declined. Labs ordered and reviewed. Risk factors for depression reviewed and negative. Hearing function and visual acuity are intact. ADLs screened and addressed as needed. Functional ability and level of safety reviewed and appropriate. Education, counseling and  referrals performed based on assessed risks today. Patient provided with a copy of personalized plan for preventive services.   2. Hereditary hemochromatosis (HCC) - Per hemotology  - CBC with Differential/Platelet; Future - Comprehensive metabolic panel; Future - TSH; Future  3. Essential hypertension - Well controlled. - CBC with Differential/Platelet; Future - Comprehensive metabolic panel; Future - TSH; Future - lisinopril (ZESTRIL) 2.5 MG tablet; Take 1 tablet (2.5 mg total) by mouth daily.  Dispense: 90 tablet; Refill: 3  4. Need for hepatitis C screening test  - Hep C Antibody; Future  Shirline Frees, NP

## 2022-10-28 LAB — HEPATITIS C ANTIBODY: Hepatitis C Ab: NONREACTIVE

## 2022-11-15 ENCOUNTER — Inpatient Hospital Stay: Payer: 59 | Attending: Hematology & Oncology

## 2022-11-15 ENCOUNTER — Other Ambulatory Visit: Payer: Self-pay

## 2022-11-15 ENCOUNTER — Inpatient Hospital Stay: Payer: 59 | Admitting: Hematology & Oncology

## 2022-11-15 ENCOUNTER — Encounter: Payer: Self-pay | Admitting: Hematology & Oncology

## 2022-11-15 DIAGNOSIS — Z79899 Other long term (current) drug therapy: Secondary | ICD-10-CM | POA: Diagnosis not present

## 2022-11-15 LAB — CBC WITH DIFFERENTIAL (CANCER CENTER ONLY)
Abs Immature Granulocytes: 0.01 10*3/uL (ref 0.00–0.07)
Basophils Absolute: 0 10*3/uL (ref 0.0–0.1)
Basophils Relative: 1 %
Eosinophils Absolute: 0.2 10*3/uL (ref 0.0–0.5)
Eosinophils Relative: 4 %
HCT: 45.3 % (ref 39.0–52.0)
Hemoglobin: 15.9 g/dL (ref 13.0–17.0)
Immature Granulocytes: 0 %
Lymphocytes Relative: 45 %
Lymphs Abs: 2 10*3/uL (ref 0.7–4.0)
MCH: 31.1 pg (ref 26.0–34.0)
MCHC: 35.1 g/dL (ref 30.0–36.0)
MCV: 88.5 fL (ref 80.0–100.0)
Monocytes Absolute: 0.4 10*3/uL (ref 0.1–1.0)
Monocytes Relative: 10 %
Neutro Abs: 1.8 10*3/uL (ref 1.7–7.7)
Neutrophils Relative %: 40 %
Platelet Count: 178 10*3/uL (ref 150–400)
RBC: 5.12 MIL/uL (ref 4.22–5.81)
RDW: 12 % (ref 11.5–15.5)
WBC Count: 4.5 10*3/uL (ref 4.0–10.5)
nRBC: 0 % (ref 0.0–0.2)

## 2022-11-15 LAB — CMP (CANCER CENTER ONLY)
ALT: 40 U/L (ref 0–44)
AST: 25 U/L (ref 15–41)
Albumin: 4.4 g/dL (ref 3.5–5.0)
Alkaline Phosphatase: 76 U/L (ref 38–126)
Anion gap: 7 (ref 5–15)
BUN: 16 mg/dL (ref 6–20)
CO2: 32 mmol/L (ref 22–32)
Calcium: 9.4 mg/dL (ref 8.9–10.3)
Chloride: 100 mmol/L (ref 98–111)
Creatinine: 1.12 mg/dL (ref 0.61–1.24)
GFR, Estimated: >60 mL/min (ref >60)
Glucose, Bld: 101 mg/dL — ABNORMAL HIGH (ref 70–99)
Potassium: 3.9 mmol/L (ref 3.5–5.1)
Sodium: 139 mmol/L (ref 135–145)
Total Bilirubin: 0.7 mg/dL (ref 0.3–1.2)
Total Protein: 7.3 g/dL (ref 6.5–8.1)

## 2022-11-15 LAB — IRON AND IRON BINDING CAPACITY (CC-WL,HP ONLY)
Iron: 146 ug/dL (ref 45–182)
Saturation Ratios: 48 % — ABNORMAL HIGH (ref 17.9–39.5)
TIBC: 304 ug/dL (ref 250–450)
UIBC: 158 ug/dL (ref 117–376)

## 2022-11-15 LAB — FERRITIN: Ferritin: 14 ng/mL — ABNORMAL LOW (ref 24–336)

## 2022-11-15 NOTE — Progress Notes (Signed)
Hematology and Oncology Follow Up Visit  Chase Roy [id number] 1986-09-28 37 y.o. 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     Interim History:  Chase Roy is in for follow-up.  We see him every 6 months.  He and his family will be going on Thursday to the [place].  They be on a cruise.  I am sure that they will have a wonderful time.  He is doing well.  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  When we last saw him, the iron studies showed a ferritin of 6 with an iron saturation of 17%.  He is still donating blood.  I think this is really a great idea for him to help himself out in the community.  He has had no problems with COVID or Influenza.  He has had no change in bowel or bladder habits.  He has had no rashes.  He has had no nausea or vomiting.  There is been no cough or shortness of breath.  Overall, I would say his performance status is probably ECOG 0.    Medications:  Current Outpatient Medications:    Ibuprofen (MOTRIN PO), Take by mouth as needed., Disp: , Rfl:    lisinopril (ZESTRIL) 2.5 MG tablet, Take 1 tablet (2.5 mg total) by mouth daily., Disp: 90 tablet, Rfl: 3   loratadine (CLARITIN) 10 MG tablet, Take 10 mg by mouth daily., Disp: , Rfl:   Allergies: No Known Allergies  Past Medical History, Surgical history, Social history, and Family History were reviewed and updated.  Review of Systems: Review of Systems  Constitutional: Negative.   HENT:  Negative.    Eyes: Negative.   Respiratory: Negative.    Cardiovascular: Negative.   Gastrointestinal: Negative.   Endocrine: Negative.   Genitourinary: Negative.    Musculoskeletal: Negative.   Skin: Negative.   Neurological: Negative.   Hematological: Negative.   Psychiatric/Behavioral: Negative.      Physical Exam:  height is 5'  7.75" (1.721 m) and weight is 166 lb (75.3 kg). His oral temperature is 98 F (36.7 C). His blood pressure is 134/86 and his pulse is 65. His respiration is 16 and oxygen saturation is 99%.   Wt Readings from Last 3 Encounters:  11/15/22 166 lb (75.3 kg)  10/27/22 164 lb (74.4 kg)  05/17/22 167 lb (75.8 kg)    Physical Exam Vitals reviewed.  HENT:     Head: Normocephalic and atraumatic.  Eyes:     Pupils: Pupils are equal, round, and reactive to light.  Cardiovascular:     Rate and Rhythm: Normal rate and regular rhythm.     Heart sounds: Normal heart sounds.  Pulmonary:     Effort: Pulmonary effort is normal.     Breath sounds: Normal breath sounds.  Abdominal:     General: Bowel sounds are normal.     Palpations: Abdomen is soft.  Musculoskeletal:        General: No tenderness or deformity. Normal range of motion.     Cervical back: Normal range of motion.  Lymphadenopathy:     Cervical: No cervical adenopathy.  Skin:    General: Skin is warm and dry.     Findings: No erythema or rash.  Neurological:     Mental Status: He is alert and oriented to  person, place, and time.  Psychiatric:        Behavior: Behavior normal.        Thought Content: Thought content normal.        Judgment: Judgment normal.      Lab Results  Component Value Date   WBC 4.5 11/15/2022   HGB 15.9 11/15/2022   HCT 45.3 11/15/2022   MCV 88.5 11/15/2022   PLT 178 11/15/2022     Chemistry      Component Value Date/Time   NA 139 11/15/2022 0827   K 3.9 11/15/2022 0827   CL 100 11/15/2022 0827   CO2 32 11/15/2022 0827   BUN 16 11/15/2022 0827   CREATININE 1.12 11/15/2022 0827   CREATININE 1.05 09/12/2020 0840      Component Value Date/Time   CALCIUM 9.4 11/15/2022 0827   ALKPHOS 76 11/15/2022 0827   AST 25 11/15/2022 0827   ALT 40 11/15/2022 0827   BILITOT 0.7 11/15/2022 0827      Impression and Plan: Chase Roy is a 37 year old white male.  He has hemochromatosis.  He is  homozygous for the major C282Y mutation.  We will see was iron studies are.  I have to believe that the iron studies will be okay.  I told him that he can still donate blood a couple times a year.  I think this is a reasonable timeframe for him.  We will plan to get him back in 6 more months.  I told to make sure that he drinks a lot of water on the trip to the [place].  I also told make sure he wears sunscreen.    Volanda Napoleon, MD 1/8/20249:28 AM

## 2023-05-16 ENCOUNTER — Encounter: Payer: Self-pay | Admitting: Hematology & Oncology

## 2023-05-16 ENCOUNTER — Other Ambulatory Visit: Payer: Self-pay

## 2023-05-16 ENCOUNTER — Inpatient Hospital Stay: Payer: 59 | Attending: Hematology & Oncology

## 2023-05-16 ENCOUNTER — Inpatient Hospital Stay: Payer: 59 | Admitting: Hematology & Oncology

## 2023-05-16 LAB — CBC WITH DIFFERENTIAL (CANCER CENTER ONLY)
Abs Immature Granulocytes: 0.03 10*3/uL (ref 0.00–0.07)
Basophils Absolute: 0 10*3/uL (ref 0.0–0.1)
Basophils Relative: 1 %
Eosinophils Absolute: 0.2 10*3/uL (ref 0.0–0.5)
Eosinophils Relative: 5 %
HCT: 42.9 % (ref 39.0–52.0)
Hemoglobin: 15 g/dL (ref 13.0–17.0)
Immature Granulocytes: 1 %
Lymphocytes Relative: 48 %
Lymphs Abs: 2.1 10*3/uL (ref 0.7–4.0)
MCH: 30.5 pg (ref 26.0–34.0)
MCHC: 35 g/dL (ref 30.0–36.0)
MCV: 87.4 fL (ref 80.0–100.0)
Monocytes Absolute: 0.4 10*3/uL (ref 0.1–1.0)
Monocytes Relative: 9 %
Neutro Abs: 1.6 10*3/uL — ABNORMAL LOW (ref 1.7–7.7)
Neutrophils Relative %: 36 %
Platelet Count: 168 10*3/uL (ref 150–400)
RBC: 4.91 MIL/uL (ref 4.22–5.81)
RDW: 12.5 % (ref 11.5–15.5)
WBC Count: 4.3 10*3/uL (ref 4.0–10.5)
nRBC: 0 % (ref 0.0–0.2)

## 2023-05-16 LAB — FERRITIN: Ferritin: 14 ng/mL — ABNORMAL LOW (ref 24–336)

## 2023-05-16 LAB — RETICULOCYTES
Immature Retic Fract: 11.2 % (ref 2.3–15.9)
RBC.: 4.81 MIL/uL (ref 4.22–5.81)
Retic Count, Absolute: 94.8 10*3/uL (ref 19.0–186.0)
Retic Ct Pct: 2 % (ref 0.4–3.1)

## 2023-05-16 LAB — IRON AND IRON BINDING CAPACITY (CC-WL,HP ONLY)
Iron: 143 ug/dL (ref 45–182)
Saturation Ratios: 43 % — ABNORMAL HIGH (ref 17.9–39.5)
TIBC: 333 ug/dL (ref 250–450)
UIBC: 190 ug/dL (ref 117–376)

## 2023-05-16 LAB — CMP (CANCER CENTER ONLY)
ALT: 32 U/L (ref 0–44)
AST: 25 U/L (ref 15–41)
Albumin: 4.5 g/dL (ref 3.5–5.0)
Alkaline Phosphatase: 66 U/L (ref 38–126)
Anion gap: 9 (ref 5–15)
BUN: 15 mg/dL (ref 6–20)
CO2: 30 mmol/L (ref 22–32)
Calcium: 9.6 mg/dL (ref 8.9–10.3)
Chloride: 100 mmol/L (ref 98–111)
Creatinine: 1.1 mg/dL (ref 0.61–1.24)
GFR, Estimated: >60 mL/min (ref >60)
Glucose, Bld: 122 mg/dL — ABNORMAL HIGH (ref 70–99)
Potassium: 3.8 mmol/L (ref 3.5–5.1)
Sodium: 139 mmol/L (ref 135–145)
Total Bilirubin: 0.8 mg/dL (ref 0.3–1.2)
Total Protein: 7.5 g/dL (ref 6.5–8.1)

## 2023-05-16 NOTE — Progress Notes (Signed)
Hematology and Oncology Follow Up Visit  Chase Roy [id number] 07/19/1986 37 y.o. 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     Interim History:  Chase Roy is in for follow-up.  We see him every 6 months.  Since we last saw him, he has been quite busy.  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  He is going to go on a cruise since we last saw them.  This was to the [place].  That a wonderful time.  He does donate blood to the [facility].  He does donate some blood.  When we last saw him, his iron saturation was 48%.  The ferritin was 14.  He has had no problems with COVID.  He has had no change in bowel or bladder habits.  He has had no cough or shortness of breath.  He has had no rashes.  Overall, his performance status is ECOG 0.     Medications:  Current Outpatient Medications:    Ibuprofen (MOTRIN PO), Take by mouth as needed., Disp: , Rfl:    lisinopril (ZESTRIL) 2.5 MG tablet, Take 1 tablet (2.5 mg total) by mouth daily., Disp: 90 tablet, Rfl: 3   loratadine (CLARITIN) 10 MG tablet, Take 10 mg by mouth daily., Disp: , Rfl:   Allergies: No Known Allergies  Past Medical History, Surgical history, Social history, and Family History were reviewed and updated.  Review of Systems: Review of Systems  Constitutional: Negative.   HENT:  Negative.    Eyes: Negative.   Respiratory: Negative.    Cardiovascular: Negative.   Gastrointestinal: Negative.   Endocrine: Negative.   Genitourinary: Negative.    Musculoskeletal: Negative.   Skin: Negative.   Neurological: Negative.   Hematological: Negative.   Psychiatric/Behavioral: Negative.      Physical Exam:  weight is 166 lb (75.3 kg). His oral temperature is 98.1 F (36.7 C). His blood pressure is 137/79 and his pulse is 78. His respiration is 16 and oxygen saturation is 99%.   Wt Readings from  Last 3 Encounters:  05/16/23 166 lb (75.3 kg)  11/15/22 166 lb (75.3 kg)  10/27/22 164 lb (74.4 kg)    Physical Exam Vitals reviewed.  HENT:     Head: Normocephalic and atraumatic.  Eyes:     Pupils: Pupils are equal, round, and reactive to light.  Cardiovascular:     Rate and Rhythm: Normal rate and regular rhythm.     Heart sounds: Normal heart sounds.  Pulmonary:     Effort: Pulmonary effort is normal.     Breath sounds: Normal breath sounds.  Abdominal:     General: Bowel sounds are normal.     Palpations: Abdomen is soft.  Musculoskeletal:        General: No tenderness or deformity. Normal range of motion.     Cervical back: Normal range of motion.  Lymphadenopathy:     Cervical: No cervical adenopathy.  Skin:    General: Skin is warm and dry.     Findings: No erythema or rash.  Neurological:     Mental Status: He is alert and oriented to person, place, and time.  Psychiatric:        Behavior: Behavior normal.        Thought Content: Thought content normal.        Judgment: Judgment normal.  Lab Results  Component Value Date   WBC 4.3 05/16/2023   HGB 15.0 05/16/2023   HCT 42.9 05/16/2023   MCV 87.4 05/16/2023   PLT 168 05/16/2023     Chemistry      Component Value Date/Time   NA 139 05/16/2023 0734   K 3.8 05/16/2023 0734   CL 100 05/16/2023 0734   CO2 30 05/16/2023 0734   BUN 15 05/16/2023 0734   CREATININE 1.10 05/16/2023 0734   CREATININE 1.05 09/12/2020 0840      Component Value Date/Time   CALCIUM 9.6 05/16/2023 0734   ALKPHOS 66 05/16/2023 0734   AST 25 05/16/2023 0734   ALT 32 05/16/2023 0734   BILITOT 0.8 05/16/2023 0734      Impression and Plan: Chase Roy is a 37 year old white male.  He has hemochromatosis.  He is homozygous for the major C282Y mutation.  We will see was iron studies are.  I have to believe that the iron studies will be okay.  I am happy that he donates blood.  Again he clearly is helping his community  out.  As always, we will plan to get him back in another 6 months.    [name], MD 7/8/20248:08 AM

## 2023-05-30 ENCOUNTER — Telehealth: Payer: Self-pay | Admitting: Adult Health

## 2023-05-30 NOTE — Telephone Encounter (Signed)
Patient dropped off document  Physicians Visit Form , to be filled out by provider. Patient requested to send it back via Call Patient to pick up and fax within 5-days. Document is located in providers tray at front office.Please advise at Mobile [phone] (mobile)

## 2023-06-01 NOTE — Telephone Encounter (Signed)
PPW faxed with confirmation.

## 2023-06-01 NOTE — Telephone Encounter (Signed)
Trying to call pt again with update but no answer

## 2023-06-23 ENCOUNTER — Encounter: Payer: Self-pay | Admitting: Hematology & Oncology

## 2023-06-23 ENCOUNTER — Encounter: Payer: Self-pay | Admitting: Adult Health

## 2023-11-03 NOTE — Progress Notes (Signed)
Subjective:    Patient ID: Chase Roy, male    DOB: 08/23/1986, 37 y.o.   MRN: [id number]  HPI  Patient presents for yearly preventative medicine examination. He is a pleasant 37 year old male who  has a past medical history of Chicken pox and Hemochromatosis.  Hemochromatosis- followed by  Dr. Myna Hidalgo.  He does do therapeutic phlebotomy 2-3 times a year at the [facility].  Denies any issues with nausea or vomiting Lab Results  Component Value Date   IRON 143 05/16/2023   TIBC 333 05/16/2023   FERRITIN 14 (L) 05/16/2023   Hypertension-managed with lisinopril 2.5 mg daily.  He denies headaches, blurred vision, chest pain, shortness of breath, or syncopal episodes.  He does check his blood pressure periodically with readings in the 120s over 80s. BP Readings from Last 3 Encounters:  11/04/23 122/84  05/16/23 137/79  11/15/22 134/86   All immunizations and health maintenance protocols were reviewed with the patient and needed orders were placed.  Appropriate screening laboratory values were ordered for the patient including screening of hyperlipidemia, renal function and hepatic function. If indicated by BPH, a PSA was ordered.  Medication reconciliation,  past medical history, social history, problem list and allergies were reviewed in detail with the patient  Goals were established with regard to weight loss, exercise, and  diet in compliance with medications Wt Readings from Last 3 Encounters:  11/04/23 172 lb (78 kg)  05/16/23 166 lb (75.3 kg)  11/15/22 166 lb (75.3 kg)   He reports that his father was diagnosed with a thoracic aortic aneurysm and his paternal grandmother passed away about a year and a half ago from a spontaneous brain bleed - she was on coumadin.   Review of Systems  Constitutional: Negative.   HENT: Negative.    Eyes: Negative.   Respiratory: Negative.    Cardiovascular: Negative.   Gastrointestinal: Negative.   Endocrine: Negative.    Genitourinary: Negative.   Musculoskeletal: Negative.   Skin: Negative.   Allergic/Immunologic: Negative.   Neurological: Negative.   Hematological: Negative.   Psychiatric/Behavioral: Negative.    All other systems reviewed and are negative.  Past Medical History:  Diagnosis Date   Chicken pox    Hemochromatosis     Social History   Socioeconomic History   Marital status: Married    Spouse name: Not on file   Number of children: Not on file   Years of education: Not on file   Highest education level: Associate degree: academic program  Occupational History   Not on file  Tobacco Use   Smoking status: Former    Current packs/day: 0.00    Types: Cigarettes    Quit date: 11/02/2006    Years since quitting: 17.0   Smokeless tobacco: Former    Types: Snuff  Substance and Sexual Activity   Alcohol use: Yes   Drug use: No   Sexual activity: Yes  Other Topics Concern   Not on file  Social History Narrative   Not on file   Social Drivers of Health   Financial Resource Strain: Low Risk  (01/11/2022)   Overall Financial Resource Strain (CARDIA)    Difficulty of Paying Living Expenses: Not hard at all  Food Insecurity: No Food Insecurity (01/11/2022)   Hunger Vital Sign    Worried About Running Out of Food in the Last Year: Never true    Ran Out of Food in the Last Year: Never true  Transportation Needs: No  Transportation Needs (01/11/2022)   PRAPARE - Administrator, Civil Service (Medical): No    Lack of Transportation (Non-Medical): No  Physical Activity: Insufficiently Active (01/11/2022)   Exercise Vital Sign    Days of Exercise per Week: 3 days    Minutes of Exercise per Session: 30 min  Stress: No Stress Concern Present (01/11/2022)   [employer] of Occupational Health - Occupational Stress Questionnaire    Feeling of Stress : Not at all  Social Connections: Moderately Integrated (01/11/2022)   Social Connection and Isolation Panel [NHANES]     Frequency of Communication with Friends and Family: More than three times a week    Frequency of Social Gatherings with Friends and Family: Once a week    Attends Religious Services: More than 4 times per year    Active Member of [employer] or Organizations: No    Attends Engineer, structural: Not on file    Marital Status: Married  Catering manager Violence: Not on file    Past Surgical History:  Procedure Laterality Date   HAND SURGERY     KNEE SURGERY     TONSILLECTOMY  1990    Family History  Problem Relation Age of Onset   Hypertension Mother    Hypertension Father    Hemochromatosis Sister    Anemia Sister     No Known Allergies  Current Outpatient Medications on File Prior to Visit  Medication Sig Dispense Refill   Ibuprofen (MOTRIN PO) Take by mouth as needed.     loratadine (CLARITIN) 10 MG tablet Take 10 mg by mouth daily.     Multiple Vitamin (MULTIVITAMIN) tablet Take 1 tablet by mouth daily.     No current facility-administered medications on file prior to visit.    BP 122/84   Pulse 83   Temp 98.5 F (36.9 C) (Oral)   Ht 5\' 8"  (1.727 m)   Wt 172 lb (78 kg)   SpO2 98%   BMI 26.15 kg/m       Objective:   Physical Exam Vitals and nursing note reviewed.  Constitutional:      General: He is not in acute distress.    Appearance: Normal appearance. He is not ill-appearing.  HENT:     Head: Normocephalic and atraumatic.     Right Ear: Tympanic membrane, ear canal and external ear normal. There is no impacted cerumen.     Left Ear: Tympanic membrane, ear canal and external ear normal. There is no impacted cerumen.     Nose: Nose normal. No congestion or rhinorrhea.     Mouth/Throat:     Mouth: Mucous membranes are moist.     Pharynx: Oropharynx is clear.  Eyes:     Extraocular Movements: Extraocular movements intact.     Conjunctiva/sclera: Conjunctivae normal.     Pupils: Pupils are equal, round, and reactive to light.  Neck:     Vascular:  No carotid bruit.  Cardiovascular:     Rate and Rhythm: Normal rate and regular rhythm.     Pulses: Normal pulses.     Heart sounds: No murmur heard.    No friction rub. No gallop.  Pulmonary:     Effort: Pulmonary effort is normal.     Breath sounds: Normal breath sounds.  Abdominal:     General: Abdomen is flat. Bowel sounds are normal. There is no distension.     Palpations: Abdomen is soft. There is no mass.     Tenderness:  There is no abdominal tenderness. There is no guarding or rebound.     Hernia: No hernia is present.  Musculoskeletal:        General: Normal range of motion.     Cervical back: Normal range of motion and neck supple.  Lymphadenopathy:     Cervical: No cervical adenopathy.  Skin:    General: Skin is warm and dry.     Capillary Refill: Capillary refill takes less than 2 seconds.  Neurological:     General: No focal deficit present.     Mental Status: He is alert and oriented to person, place, and time.  Psychiatric:        Mood and Affect: Mood normal.        Behavior: Behavior normal.        Thought Content: Thought content normal.        Judgment: Judgment normal.        Assessment & Plan:  1. Routine general medical examination at a health care facility (Primary) Today patient counseled on age appropriate routine health concerns for screening and prevention, each reviewed and up to date or declined. Immunizations reviewed and up to date or declined. Labs ordered and reviewed. Risk factors for depression reviewed and negative. Hearing function and visual acuity are intact. ADLs screened and addressed as needed. Functional ability and level of safety reviewed and appropriate. Education, counseling and referrals performed based on assessed risks today. Patient provided with a copy of personalized plan for preventive services. -Continue to stay active and eat healthy.  - We discussed imaging due to family history of aortic aneurysms. He will hold off for  now.  - Follow up in one year or sooner if needed  2. Hereditary hemochromatosis (HCC) - Follow up with Hematology as directed  - Lipid panel; Future - CBC; Future - Comprehensive metabolic panel; Future - Comprehensive metabolic panel - Lipid panel - CBC  3. Essential hypertension - Well controlled. No change in medication  - Lipid panel; Future - CBC; Future - Comprehensive metabolic panel; Future - lisinopril (ZESTRIL) 2.5 MG tablet; Take 1 tablet (2.5 mg total) by mouth daily.  Dispense: 90 tablet; Refill: 3 - Comprehensive metabolic panel - Lipid panel - CBC  Shirline Frees, NP

## 2023-11-04 ENCOUNTER — Ambulatory Visit (INDEPENDENT_AMBULATORY_CARE_PROVIDER_SITE_OTHER): Payer: 59 | Admitting: Adult Health

## 2023-11-04 ENCOUNTER — Encounter: Payer: Self-pay | Admitting: Adult Health

## 2023-11-04 VITALS — BP 122/84 | HR 83 | Temp 98.5°F | Ht 68.0 in | Wt 172.0 lb

## 2023-11-04 DIAGNOSIS — I10 Essential (primary) hypertension: Secondary | ICD-10-CM | POA: Diagnosis not present

## 2023-11-04 DIAGNOSIS — Z0000 Encounter for general adult medical examination without abnormal findings: Secondary | ICD-10-CM | POA: Diagnosis not present

## 2023-11-04 LAB — COMPREHENSIVE METABOLIC PANEL
ALT: 40 U/L (ref 0–53)
AST: 30 U/L (ref 0–37)
Albumin: 4.5 g/dL (ref 3.5–5.2)
Alkaline Phosphatase: 80 U/L (ref 39–117)
BUN: 17 mg/dL (ref 6–23)
CO2: 27 meq/L (ref 19–32)
Calcium: 9.5 mg/dL (ref 8.4–10.5)
Chloride: 101 meq/L (ref 96–112)
Creatinine, Ser: 1.1 mg/dL (ref 0.40–1.50)
GFR: 85.17 mL/min (ref >60.00)
Glucose, Bld: 94 mg/dL (ref 70–99)
Potassium: 3.5 meq/L (ref 3.5–5.1)
Sodium: 137 meq/L (ref 135–145)
Total Bilirubin: 0.6 mg/dL (ref 0.2–1.2)
Total Protein: 7.5 g/dL (ref 6.0–8.3)

## 2023-11-04 LAB — CBC
HCT: 46.9 % (ref 39.0–52.0)
Hemoglobin: 16.1 g/dL (ref 13.0–17.0)
MCHC: 34.3 g/dL (ref 30.0–36.0)
MCV: 89.4 fL (ref 78.0–100.0)
Platelets: 195 10*3/uL (ref 150.0–400.0)
RBC: 5.24 Mil/uL (ref 4.22–5.81)
RDW: 13.8 % (ref 11.5–15.5)
WBC: 5.2 10*3/uL (ref 4.0–10.5)

## 2023-11-04 LAB — LIPID PANEL
Cholesterol: 196 mg/dL (ref 0–200)
HDL: 63.4 mg/dL (ref >39.00)
LDL Cholesterol: 108 mg/dL — ABNORMAL HIGH (ref 0–99)
NonHDL: 132.79
Total CHOL/HDL Ratio: 3
Triglycerides: 122 mg/dL (ref 0.0–149.0)
VLDL: 24.4 mg/dL (ref 0.0–40.0)

## 2023-11-04 MED ORDER — LISINOPRIL 2.5 MG PO TABS: 2.5000 mg | ORAL_TABLET | Freq: Every day | ORAL | 3 refills | Status: DC

## 2023-11-16 ENCOUNTER — Inpatient Hospital Stay: Payer: 59 | Admitting: Hematology & Oncology

## 2023-11-16 ENCOUNTER — Inpatient Hospital Stay: Payer: 59 | Attending: Hematology & Oncology

## 2023-11-16 ENCOUNTER — Encounter: Payer: Self-pay | Admitting: Hematology & Oncology

## 2023-11-16 LAB — CBC WITH DIFFERENTIAL (CANCER CENTER ONLY)
Abs Immature Granulocytes: 0.08 10*3/uL — ABNORMAL HIGH (ref 0.00–0.07)
Basophils Absolute: 0 10*3/uL (ref 0.0–0.1)
Basophils Relative: 1 %
Eosinophils Absolute: 0.1 10*3/uL (ref 0.0–0.5)
Eosinophils Relative: 2 %
HCT: 40.7 % (ref 39.0–52.0)
Hemoglobin: 14.3 g/dL (ref 13.0–17.0)
Immature Granulocytes: 2 %
Lymphocytes Relative: 21 %
Lymphs Abs: 1 10*3/uL (ref 0.7–4.0)
MCH: 31 pg (ref 26.0–34.0)
MCHC: 35.1 g/dL (ref 30.0–36.0)
MCV: 88.3 fL (ref 80.0–100.0)
Monocytes Absolute: 0.7 10*3/uL (ref 0.1–1.0)
Monocytes Relative: 14 %
Neutro Abs: 2.8 10*3/uL (ref 1.7–7.7)
Neutrophils Relative %: 60 %
Platelet Count: 141 10*3/uL — ABNORMAL LOW (ref 150–400)
RBC: 4.61 MIL/uL (ref 4.22–5.81)
RDW: 12.8 % (ref 11.5–15.5)
WBC Count: 4.6 10*3/uL (ref 4.0–10.5)
nRBC: 0 % (ref 0.0–0.2)

## 2023-11-16 LAB — CMP (CANCER CENTER ONLY)
ALT: 26 U/L (ref 0–44)
AST: 20 U/L (ref 15–41)
Albumin: 4.4 g/dL (ref 3.5–5.0)
Alkaline Phosphatase: 76 U/L (ref 38–126)
Anion gap: 7 (ref 5–15)
BUN: 13 mg/dL (ref 6–20)
CO2: 31 mmol/L (ref 22–32)
Calcium: 9.4 mg/dL (ref 8.9–10.3)
Chloride: 101 mmol/L (ref 98–111)
Creatinine: 1.19 mg/dL (ref 0.61–1.24)
GFR, Estimated: >60 mL/min (ref >60)
Glucose, Bld: 86 mg/dL (ref 70–99)
Potassium: 3.7 mmol/L (ref 3.5–5.1)
Sodium: 139 mmol/L (ref 135–145)
Total Bilirubin: 0.7 mg/dL (ref 0.0–1.2)
Total Protein: 7.2 g/dL (ref 6.5–8.1)

## 2023-11-16 LAB — IRON AND IRON BINDING CAPACITY (CC-WL,HP ONLY)
Iron: 48 ug/dL (ref 45–182)
Saturation Ratios: 14 % — ABNORMAL LOW (ref 17.9–39.5)
TIBC: 333 ug/dL (ref 250–450)
UIBC: 285 ug/dL (ref 117–376)

## 2023-11-16 LAB — RETICULOCYTES
Immature Retic Fract: 11.3 % (ref 2.3–15.9)
RBC.: 4.62 MIL/uL (ref 4.22–5.81)
Retic Count, Absolute: 81.3 10*3/uL (ref 19.0–186.0)
Retic Ct Pct: 1.8 % (ref 0.4–3.1)

## 2023-11-16 LAB — FERRITIN: Ferritin: 18 ng/mL — ABNORMAL LOW (ref 24–336)

## 2023-11-16 NOTE — Progress Notes (Signed)
 Hematology and Oncology Follow Up Visit  Chase Roy [id number] [birth year] 38 y.o. 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     Interim History:  Chase Roy is in for follow-up.  We see him every 6 months.  Since we last saw him, has been doing quite well.  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  He has been quite busy.  He enjoyed the holiday season.  He did have a temperature last night of 102 degrees.  He has had a dry cough.  He has had no diarrhea.  He has not tested for COVID yet.  I really think he needs to do this.  He has been donating blood.  His last iron studiesback in July showed a ferritin of 14 with an iron saturation of 43%.  He has had no change in bowel or bladder habits.  There has been no nausea or vomiting.  He has had no rashes.  There has been no mouth sores.  Overall, I would say his performance status is probably ECOG 1.    Medications:  Current Outpatient Medications:    Ibuprofen (MOTRIN PO), Take by mouth as needed., Disp: , Rfl:    lisinopril  (ZESTRIL ) 2.5 MG tablet, Take 1 tablet (2.5 mg total) by mouth daily., Disp: 90 tablet, Rfl: 3   Multiple Vitamin (MULTIVITAMIN) tablet, Take 1 tablet by mouth daily., Disp: , Rfl:    loratadine (CLARITIN) 10 MG tablet, Take 10 mg by mouth daily. (Patient not taking: Reported on 11/16/2023), Disp: , Rfl:   Allergies: No Known Allergies  Past Medical History, Surgical history, Social history, and Family History were reviewed and updated.  Review of Systems: Review of Systems  Constitutional: Negative.   HENT:  Negative.    Eyes: Negative.   Respiratory: Negative.    Cardiovascular: Negative.   Gastrointestinal: Negative.   Endocrine: Negative.   Genitourinary: Negative.    Musculoskeletal: Negative.   Skin: Negative.   Neurological: Negative.    Hematological: Negative.   Psychiatric/Behavioral: Negative.      Physical Exam:  height is 5' 8 (1.727 m) and weight is 173 lb (78.5 kg). His oral temperature is 98.2 F (36.8 C). His blood pressure is 128/94 (abnormal) and his pulse is 88. His respiration is 20 and oxygen saturation is 97%.   Wt Readings from Last 3 Encounters:  11/16/23 173 lb (78.5 kg)  11/04/23 172 lb (78 kg)  05/16/23 166 lb (75.3 kg)    Physical Exam Vitals reviewed.  HENT:     Head: Normocephalic and atraumatic.  Eyes:     Pupils: Pupils are equal, round, and reactive to light.  Cardiovascular:     Rate and Rhythm: Normal rate and regular rhythm.     Heart sounds: Normal heart sounds.  Pulmonary:     Effort: Pulmonary effort is normal.     Breath sounds: Normal breath sounds.  Abdominal:     General: Bowel sounds are normal.     Palpations: Abdomen is soft.  Musculoskeletal:        General: No tenderness or deformity. Normal range of motion.     Cervical back: Normal range of motion.  Lymphadenopathy:     Cervical: No cervical adenopathy.  Skin:    General: Skin is warm and dry.     Findings: No  erythema or rash.  Neurological:     Mental Status: He is alert and oriented to person, place, and time.  Psychiatric:        Behavior: Behavior normal.        Thought Content: Thought content normal.        Judgment: Judgment normal.      Lab Results  Component Value Date   WBC 4.6 11/16/2023   HGB 14.3 11/16/2023   HCT 40.7 11/16/2023   MCV 88.3 11/16/2023   PLT 141 (L) 11/16/2023     Chemistry      Component Value Date/Time   NA 137 11/04/2023 0819   K 3.5 11/04/2023 0819   CL 101 11/04/2023 0819   CO2 27 11/04/2023 0819   BUN 17 11/04/2023 0819   CREATININE 1.10 11/04/2023 0819   CREATININE 1.10 05/16/2023 0734   CREATININE 1.05 09/12/2020 0840      Component Value Date/Time   CALCIUM 9.5 11/04/2023 0819   ALKPHOS 80 11/04/2023 0819   AST 30 11/04/2023 0819   AST 25  05/16/2023 0734   ALT 40 11/04/2023 0819   ALT 32 05/16/2023 0734   BILITOT 0.6 11/04/2023 0819   BILITOT 0.8 05/16/2023 0734      Impression and Plan: [name] is a 38 year old white male.  He has hemochromatosis.  He is homozygous for the major C282Y mutation.  We will see was iron studies are.  I have to believe that the iron studies will be okay.  As long as he continues to donate blood, he should be okay.  Hopefully, he does not have COVID.  I know that COVID is out there.  We will still plan to get him back in 6 months.   Maude JONELLE Crease, MD 1/8/20258:03 AM

## 2023-11-16 NOTE — Progress Notes (Signed)
 BP remains elevated, took BP meds one hour ago. Instructed to monitor at home and notify PCP if it remains over 140/90. Verbalized understanding.

## 2024-05-15 ENCOUNTER — Inpatient Hospital Stay: Payer: 59

## 2024-05-15 ENCOUNTER — Inpatient Hospital Stay: Attending: Hematology & Oncology

## 2024-05-15 ENCOUNTER — Inpatient Hospital Stay: Admitting: Family

## 2024-05-15 ENCOUNTER — Ambulatory Visit: Payer: 59 | Admitting: Hematology & Oncology

## 2024-05-15 ENCOUNTER — Ambulatory Visit: Payer: Self-pay | Admitting: Hematology & Oncology

## 2024-05-15 LAB — CBC WITH DIFFERENTIAL (CANCER CENTER ONLY)
Abs Immature Granulocytes: 0.01 K/uL (ref 0.00–0.07)
Basophils Absolute: 0 K/uL (ref 0.0–0.1)
Basophils Relative: 1 %
Eosinophils Absolute: 0.2 K/uL (ref 0.0–0.5)
Eosinophils Relative: 5 %
HCT: 46 % (ref 39.0–52.0)
Hemoglobin: 16.4 g/dL (ref 13.0–17.0)
Immature Granulocytes: 0 %
Lymphocytes Relative: 45 %
Lymphs Abs: 1.8 K/uL (ref 0.7–4.0)
MCH: 30.9 pg (ref 26.0–34.0)
MCHC: 35.7 g/dL (ref 30.0–36.0)
MCV: 86.6 fL (ref 80.0–100.0)
Monocytes Absolute: 0.4 K/uL (ref 0.1–1.0)
Monocytes Relative: 10 %
Neutro Abs: 1.5 K/uL — ABNORMAL LOW (ref 1.7–7.7)
Neutrophils Relative %: 39 %
Platelet Count: 187 K/uL (ref 150–400)
RBC: 5.31 MIL/uL (ref 4.22–5.81)
RDW: 12.9 % (ref 11.5–15.5)
WBC Count: 4 K/uL (ref 4.0–10.5)
nRBC: 0 % (ref 0.0–0.2)

## 2024-05-15 LAB — CMP (CANCER CENTER ONLY)
ALT: 29 U/L (ref 0–44)
AST: 23 U/L (ref 15–41)
Albumin: 4.8 g/dL (ref 3.5–5.0)
Alkaline Phosphatase: 74 U/L (ref 38–126)
Anion gap: 8 (ref 5–15)
BUN: 17 mg/dL (ref 6–20)
CO2: 29 mmol/L (ref 22–32)
Calcium: 10 mg/dL (ref 8.9–10.3)
Chloride: 103 mmol/L (ref 98–111)
Creatinine: 1.26 mg/dL — ABNORMAL HIGH (ref 0.61–1.24)
GFR, Estimated: >60 mL/min (ref >60)
Glucose, Bld: 109 mg/dL — ABNORMAL HIGH (ref 70–99)
Potassium: 4 mmol/L (ref 3.5–5.1)
Sodium: 140 mmol/L (ref 135–145)
Total Bilirubin: 0.7 mg/dL (ref 0.0–1.2)
Total Protein: 7.7 g/dL (ref 6.5–8.1)

## 2024-05-15 LAB — IRON AND IRON BINDING CAPACITY (CC-WL,HP ONLY)
Iron: 95 ug/dL (ref 45–182)
Saturation Ratios: 26 % (ref 17.9–39.5)
TIBC: 361 ug/dL (ref 250–450)
UIBC: 266 ug/dL (ref 117–376)

## 2024-05-15 LAB — FERRITIN: Ferritin: 32 ng/mL (ref 24–336)

## 2024-05-15 NOTE — Progress Notes (Signed)
 Hematology and Oncology Follow Up Visit  Chase Roy [id number] 16-Mar-1986 38 y.o. 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   Interim History:  Chase Roy is here today for follow-up. He is doing well and has no complaints at this time.  [facility].  Iron studies have remained stable in the past. Today's results are pending.  No fever, chills, n/v, cough, rash, dizziness, headache/migraines, SOB, chest pain, palpitations, abdominal pain or changes in bowel or bladder habits.  No hot flashes or night sweats.  No swelling, tenderness, numbness or tingling in his extremities.  No falls or syncope.  Appetite and hydration are good. Weight is stable at 168 lbs.   ECOG Performance Status: 1 - Symptomatic but completely ambulatory  Medications:  Allergies as of 05/15/2024   No Known Allergies      Medication List        Accurate as of May 15, 2024  9:17 AM. If you have any questions, ask your nurse or doctor.          lisinopril  2.5 MG tablet Commonly known as: ZESTRIL  Take 1 tablet (2.5 mg total) by mouth daily.   loratadine 10 MG tablet Commonly known as: CLARITIN Take 10 mg by mouth daily.   MOTRIN PO Take by mouth as needed.   multivitamin tablet Take 1 tablet by mouth daily.        Allergies: No Known Allergies  Past Medical History, Surgical history, Social history, and Family History were reviewed and updated.  Review of Systems: All other 10 point review of systems is negative.   Physical Exam:  height is 5' 8 (1.727 m) and weight is 168 lb (76.2 kg). His oral temperature is 98.1 F (36.7 C). His blood pressure is 122/86 and his pulse is 79. His respiration is 17 and oxygen saturation is 100%.   Wt Readings from Last 3 Encounters:  05/15/24 168 lb (76.2 kg)  11/16/23  173 lb (78.5 kg)  11/04/23 172 lb (78 kg)    Ocular: Sclerae unicteric, pupils equal, round and reactive to light Ear-nose-throat: Oropharynx clear, dentition fair Lymphatic: No cervical or supraclavicular adenopathy Lungs no rales or rhonchi, good excursion bilaterally Heart regular rate and rhythm, no murmur appreciated Abd soft, nontender, positive bowel sounds MSK no focal spinal tenderness, no joint edema Neuro: non-focal, well-oriented, appropriate affect Breasts: Deferred   Lab Results  Component Value Date   WBC 4.0 05/15/2024   HGB 16.4 05/15/2024   HCT 46.0 05/15/2024   MCV 86.6 05/15/2024   PLT 187 05/15/2024   Lab Results  Component Value Date   FERRITIN 18 (L) 11/16/2023   IRON 48 11/16/2023   TIBC 333 11/16/2023   UIBC 285 11/16/2023   IRONPCTSAT 14 (L) 11/16/2023   Lab Results  Component Value Date   RETICCTPCT 1.8 11/16/2023   RBC 5.31 05/15/2024   No results found for: KPAFRELGTCHN, LAMBDASER, KAPLAMBRATIO No results found for: IGGSERUM, IGA, IGMSERUM No results found for: TOTALPROTELP, ALBUMINELP, A1GS, A2GS, BETS, BETA2SER, GAMS, MSPIKE, SPEI   Chemistry      Component Value Date/Time   NA 140 05/15/2024 0825   K 4.0 05/15/2024 0825   CL 103 05/15/2024 0825   CO2 29 05/15/2024 0825   BUN 17 05/15/2024 0825   CREATININE  1.26 (H) 05/15/2024 0825   CREATININE 1.05 09/12/2020 0840      Component Value Date/Time   CALCIUM 10.0 05/15/2024 0825   ALKPHOS 74 05/15/2024 0825   AST 23 05/15/2024 0825   ALT 29 05/15/2024 0825   BILITOT 0.7 05/15/2024 0825       Impression and Plan: Chase Roy is a 38 yo caucasian gentleman with hemochromatosis, homozygous for the major C282Y mutation.  Iron studies are pending.  [facility].  Follow-up in 6 months.   Lauraine Pepper, NP 7/8/20259:17 AM

## 2024-11-06 ENCOUNTER — Ambulatory Visit (INDEPENDENT_AMBULATORY_CARE_PROVIDER_SITE_OTHER): Payer: 59 | Admitting: Adult Health

## 2024-11-06 ENCOUNTER — Encounter: Payer: Self-pay | Admitting: Adult Health

## 2024-11-06 ENCOUNTER — Ambulatory Visit: Payer: Self-pay | Admitting: Adult Health

## 2024-11-06 VITALS — BP 128/82 | HR 76 | Temp 98.2°F | Ht 68.0 in | Wt 172.2 lb

## 2024-11-06 DIAGNOSIS — Z1159 Encounter for screening for other viral diseases: Secondary | ICD-10-CM | POA: Diagnosis not present

## 2024-11-06 DIAGNOSIS — I10 Essential (primary) hypertension: Secondary | ICD-10-CM

## 2024-11-06 DIAGNOSIS — Z114 Encounter for screening for human immunodeficiency virus [HIV]: Secondary | ICD-10-CM | POA: Diagnosis not present

## 2024-11-06 DIAGNOSIS — Z0000 Encounter for general adult medical examination without abnormal findings: Secondary | ICD-10-CM | POA: Diagnosis not present

## 2024-11-06 LAB — COMPREHENSIVE METABOLIC PANEL WITH GFR
ALT: 35 U/L (ref 3–53)
AST: 28 U/L (ref 5–37)
Albumin: 4.7 g/dL (ref 3.5–5.2)
Alkaline Phosphatase: 82 U/L (ref 39–117)
BUN: 19 mg/dL (ref 6–23)
CO2: 31 meq/L (ref 19–32)
Calcium: 9.9 mg/dL (ref 8.4–10.5)
Chloride: 100 meq/L (ref 96–112)
Creatinine, Ser: 1.05 mg/dL (ref 0.40–1.50)
GFR: 89.43 mL/min
Glucose, Bld: 86 mg/dL (ref 70–99)
Potassium: 3.8 meq/L (ref 3.5–5.1)
Sodium: 138 meq/L (ref 135–145)
Total Bilirubin: 0.6 mg/dL (ref 0.2–1.2)
Total Protein: 7.9 g/dL (ref 6.0–8.3)

## 2024-11-06 LAB — CBC
HCT: 46.5 % (ref 39.0–52.0)
Hemoglobin: 16.1 g/dL (ref 13.0–17.0)
MCHC: 34.6 g/dL (ref 30.0–36.0)
MCV: 89.3 fl (ref 78.0–100.0)
Platelets: 192 K/uL (ref 150.0–400.0)
RBC: 5.21 Mil/uL (ref 4.22–5.81)
RDW: 12.6 % (ref 11.5–15.5)
WBC: 4.7 K/uL (ref 4.0–10.5)

## 2024-11-06 LAB — LIPID PANEL
Cholesterol: 204 mg/dL — ABNORMAL HIGH (ref 28–200)
HDL: 64.8 mg/dL
LDL Cholesterol: 120 mg/dL — ABNORMAL HIGH (ref 10–99)
NonHDL: 139.18
Total CHOL/HDL Ratio: 3
Triglycerides: 95 mg/dL (ref 10.0–149.0)
VLDL: 19 mg/dL (ref 0.0–40.0)

## 2024-11-06 MED ORDER — LISINOPRIL 2.5 MG PO TABS: 2.5000 mg | ORAL_TABLET | Freq: Every day | ORAL | 3 refills | Status: AC

## 2024-11-06 NOTE — Progress Notes (Signed)
 "  Subjective:    Patient ID: Chase Roy, male    DOB: October 06, 1986, 38 y.o.   MRN: [id number]  HPI Patient presents for yearly preventative medicine examination. He is a pleasant 38 year old male who  has a past medical history of Chicken pox and Hemochromatosis.  Hemochromatosis- followed by  Dr. Timmy.  He does do therapeutic phlebotomy 2-3 times a year at the [facility].  Denies any issues with nausea or vomiting Lab Results  Component Value Date   IRON 95 05/15/2024   TIBC 361 05/15/2024   FERRITIN 32 05/15/2024   Hypertension-managed with lisinopril  2.5 mg daily.  He denies headaches, blurred vision, chest pain, shortness of breath, or syncopal episodes.  He does check his blood pressure periodically with readings in the 120s over 80s. BP Readings from Last 3 Encounters:  11/06/24 128/82  05/15/24 122/86  11/16/23 (!) 128/94    All immunizations and health maintenance protocols were reviewed with the patient and needed orders were placed.  Appropriate screening laboratory values were ordered for the patient including screening of hyperlipidemia, renal function and hepatic function.  Medication reconciliation,  past medical history, social history, problem list and allergies were reviewed in detail with the patient  Goals were established with regard to weight loss, exercise, and  diet in compliance with medications Wt Readings from Last 3 Encounters:  11/06/24 172 lb 3.2 oz (78.1 kg)  05/15/24 168 lb (76.2 kg)  11/16/23 173 lb (78.5 kg)    Review of Systems  Constitutional: Negative.   HENT: Negative.    Eyes: Negative.   Respiratory: Negative.    Cardiovascular: Negative.   Gastrointestinal: Negative.   Endocrine: Negative.   Genitourinary: Negative.   Musculoskeletal: Negative.   Skin: Negative.   Allergic/Immunologic: Negative.   Neurological: Negative.   Hematological: Negative.   Psychiatric/Behavioral: Negative.    All other systems reviewed and are  negative.   Past Medical History:  Diagnosis Date   Chicken pox    Hemochromatosis     Social History   Socioeconomic History   Marital status: Married    Spouse name: Not on file   Number of children: Not on file   Years of education: Not on file   Highest education level: Associate degree: academic program  Occupational History   Not on file  Tobacco Use   Smoking status: Former    Current packs/day: 0.00    Types: Cigarettes    Quit date: 11/02/2006    Years since quitting: 18.0   Smokeless tobacco: Former    Types: Snuff  Vaping Use   Vaping status: Never Used  Substance and Sexual Activity   Alcohol use: Yes    Alcohol/week: 2.0 standard drinks of alcohol    Types: 2 Cans of beer per week   Drug use: No   Sexual activity: Yes  Other Topics Concern   Not on file  Social History Narrative   Not on file   Social Drivers of Health   Tobacco Use: Medium Risk (11/06/2024)   Patient History    Smoking Tobacco Use: Former    Smokeless Tobacco Use: Former    Passive Exposure: Not on Actuary Strain: Low Risk (11/02/2024)   Overall Financial Resource Strain (CARDIA)    Difficulty of Paying Living Expenses: Not hard at all  Food Insecurity: No Food Insecurity (11/02/2024)   Epic    Worried About Radiation Protection Practitioner of Food in the Last Year: Never true  Ran Out of Food in the Last Year: Never true  Transportation Needs: No Transportation Needs (11/02/2024)   Epic    Lack of Transportation (Medical): No    Lack of Transportation (Non-Medical): No  Physical Activity: Sufficiently Active (11/02/2024)   Exercise Vital Sign    Days of Exercise per Week: 4 days    Minutes of Exercise per Session: 40 min  Stress: No Stress Concern Present (11/02/2024)   [employer] of Occupational Health - Occupational Stress Questionnaire    Feeling of Stress: Not at all  Social Connections: Moderately Integrated (11/02/2024)   Social Connection and Isolation Panel     Frequency of Communication with Friends and Family: More than three times a week    Frequency of Social Gatherings with Friends and Family: Twice a week    Attends Religious Services: More than 4 times per year    Active Member of [employer] or Organizations: No    Attends Engineer, Structural: Not on file    Marital Status: Married  Catering Manager Violence: Not on file  Depression (PHQ2-9): Low Risk (05/15/2024)   Depression (PHQ2-9)    PHQ-2 Score: 0  Alcohol Screen: Low Risk (11/02/2024)   Alcohol Screen    Last Alcohol Screening Score (AUDIT): 3  Housing: Low Risk (11/02/2024)   Epic    Unable to Pay for Housing in the Last Year: No    Number of Times Moved in the Last Year: 0    Homeless in the Last Year: No  Utilities: Not on file  Health Literacy: Not on file    Past Surgical History:  Procedure Laterality Date   HAND SURGERY     KNEE SURGERY     TONSILLECTOMY  1990    Family History  Problem Relation Age of Onset   Hypertension Mother    Hypertension Father    Aortic aneurysm Father    Hemochromatosis Sister    Anemia Sister     Allergies[1]  Medications Ordered Prior to Encounter[2]  BP 128/82   Pulse 76   Temp 98.2 F (36.8 C)   Ht 5' 8 (1.727 m)   Wt 172 lb 3.2 oz (78.1 kg)   SpO2 97%   BMI 26.18 kg/m       Objective:   Physical Exam Vitals and nursing note reviewed.  Constitutional:      General: He is not in acute distress.    Appearance: Normal appearance. He is not ill-appearing.  HENT:     Head: Normocephalic and atraumatic.     Right Ear: Tympanic membrane, ear canal and external ear normal. There is no impacted cerumen.     Left Ear: Tympanic membrane, ear canal and external ear normal. There is no impacted cerumen.     Nose: Nose normal. No congestion or rhinorrhea.     Mouth/Throat:     Mouth: Mucous membranes are moist.     Pharynx: Oropharynx is clear.  Eyes:     Extraocular Movements: Extraocular movements intact.      Conjunctiva/sclera: Conjunctivae normal.     Pupils: Pupils are equal, round, and reactive to light.  Neck:     Vascular: No carotid bruit.  Cardiovascular:     Rate and Rhythm: Normal rate and regular rhythm.     Pulses: Normal pulses.     Heart sounds: No murmur heard.    No friction rub. No gallop.  Pulmonary:     Effort: Pulmonary effort is normal.  Breath sounds: Normal breath sounds.  Abdominal:     General: Abdomen is flat. Bowel sounds are normal. There is no distension.     Palpations: Abdomen is soft. There is no mass.     Tenderness: There is no abdominal tenderness. There is no guarding or rebound.     Hernia: No hernia is present.  Musculoskeletal:        General: Normal range of motion.     Cervical back: Normal range of motion and neck supple.  Lymphadenopathy:     Cervical: No cervical adenopathy.  Skin:    General: Skin is warm and dry.     Capillary Refill: Capillary refill takes less than 2 seconds.  Neurological:     General: No focal deficit present.     Mental Status: He is alert and oriented to person, place, and time.  Psychiatric:        Mood and Affect: Mood normal.        Behavior: Behavior normal.        Thought Content: Thought content normal.        Judgment: Judgment normal.       Assessment & Plan:  1. Routine general medical examination at a health care facility (Primary) Today patient counseled on age appropriate routine health concerns for screening and prevention, each reviewed and up to date or declined. Immunizations reviewed and up to date or declined. Labs ordered and reviewed. Risk factors for depression reviewed and negative. Hearing function and visual acuity are intact. ADLs screened and addressed as needed. Functional ability and level of safety reviewed and appropriate. Education, counseling and referrals performed based on assessed risks today. Patient provided with a copy of personalized plan for preventive services. - Follow  up in one year or sooner if needed - Eat healthy and exercise   2. Hereditary hemochromatosis - Follow up with Oncology as directed  - Lipid panel; Future - CBC; Future - Comprehensive metabolic panel with GFR; Future  3. Essential hypertension - Well controlled. No change in medication therapy  - Lipid panel; Future - CBC; Future - Comprehensive metabolic panel with GFR; Future - lisinopril  (ZESTRIL ) 2.5 MG tablet; Take 1 tablet (2.5 mg total) by mouth daily.  Dispense: 90 tablet; Refill: 3  4. Need for hepatitis C screening test  - Hep C Antibody; Future  5. Encounter for screening for HIV  - HIV Antibody (routine testing w rflx); Future  Darleene Shape, NP     [1] No Known Allergies [2]  Current Outpatient Medications on File Prior to Visit  Medication Sig Dispense Refill   Ibuprofen (MOTRIN PO) Take by mouth as needed.     lisinopril  (ZESTRIL ) 2.5 MG tablet Take 1 tablet (2.5 mg total) by mouth daily. 90 tablet 3   loratadine (CLARITIN) 10 MG tablet Take 10 mg by mouth daily.     Multiple Vitamin (MULTIVITAMIN) tablet Take 1 tablet by mouth daily.     No current facility-administered medications on file prior to visit.   "

## 2024-11-07 LAB — HIV ANTIBODY (ROUTINE TESTING W REFLEX)
HIV 1&2 Ab, 4th Generation: NONREACTIVE
HIV FINAL INTERPRETATION: NEGATIVE

## 2024-11-07 LAB — HEPATITIS C ANTIBODY: Hepatitis C Ab: NONREACTIVE

## 2024-11-16 ENCOUNTER — Inpatient Hospital Stay: Admitting: Family

## 2024-11-16 ENCOUNTER — Inpatient Hospital Stay: Attending: Family

## 2024-11-16 ENCOUNTER — Encounter: Payer: Self-pay | Admitting: Family

## 2024-11-16 LAB — CBC WITH DIFFERENTIAL (CANCER CENTER ONLY)
Abs Immature Granulocytes: 0.01 K/uL (ref 0.00–0.07)
Basophils Absolute: 0.1 K/uL (ref 0.0–0.1)
Basophils Relative: 1 %
Eosinophils Absolute: 0.1 K/uL (ref 0.0–0.5)
Eosinophils Relative: 4 %
HCT: 46.2 % (ref 39.0–52.0)
Hemoglobin: 15.9 g/dL (ref 13.0–17.0)
Immature Granulocytes: 0 %
Lymphocytes Relative: 44 %
Lymphs Abs: 1.7 K/uL (ref 0.7–4.0)
MCH: 30.5 pg (ref 26.0–34.0)
MCHC: 34.4 g/dL (ref 30.0–36.0)
MCV: 88.5 fL (ref 80.0–100.0)
Monocytes Absolute: 0.4 K/uL (ref 0.1–1.0)
Monocytes Relative: 11 %
Neutro Abs: 1.5 K/uL — ABNORMAL LOW (ref 1.7–7.7)
Neutrophils Relative %: 40 %
Platelet Count: 166 K/uL (ref 150–400)
RBC: 5.22 MIL/uL (ref 4.22–5.81)
RDW: 11.9 % (ref 11.5–15.5)
WBC Count: 3.7 K/uL — ABNORMAL LOW (ref 4.0–10.5)
nRBC: 0 % (ref 0.0–0.2)

## 2024-11-16 LAB — CMP (CANCER CENTER ONLY)
ALT: 35 U/L (ref 0–44)
AST: 29 U/L (ref 15–41)
Albumin: 4.7 g/dL (ref 3.5–5.0)
Alkaline Phosphatase: 81 U/L (ref 38–126)
Anion gap: 9 (ref 5–15)
BUN: 18 mg/dL (ref 6–20)
CO2: 27 mmol/L (ref 22–32)
Calcium: 9.6 mg/dL (ref 8.9–10.3)
Chloride: 103 mmol/L (ref 98–111)
Creatinine: 1.03 mg/dL (ref 0.61–1.24)
GFR, Estimated: >60 mL/min
Glucose, Bld: 93 mg/dL (ref 70–99)
Potassium: 4.4 mmol/L (ref 3.5–5.1)
Sodium: 139 mmol/L (ref 135–145)
Total Bilirubin: 0.7 mg/dL (ref 0.0–1.2)
Total Protein: 7.2 g/dL (ref 6.5–8.1)

## 2024-11-16 LAB — IRON AND IRON BINDING CAPACITY (CC-WL,HP ONLY)
Iron: 163 ug/dL (ref 45–182)
Saturation Ratios: 44 % — ABNORMAL HIGH (ref 17.9–39.5)
TIBC: 367 ug/dL (ref 250–450)
UIBC: 204 ug/dL

## 2024-11-16 LAB — FERRITIN: Ferritin: 26 ng/mL (ref 24–336)

## 2024-11-16 NOTE — Progress Notes (Signed)
 " Hematology and Oncology Follow Up Visit  Chase Roy [id number] June 19, 1986 39 y.o. 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   Interim History:  Chase Roy is here today for follow-up. He is doing well and has no complaints at this time.  Iron studies have been stable. He continues to go donate with the [facility] a couple times a year.  No fever, chills, n/v, cough, rash/itching, dizziness, headache, SOB, chest pain, palpitations, abdominal pain or changes in bowel or bladder habits.  No vision changes or loss.  No swelling, tenderness, numbness or tingling in his extremities.  No falls or syncope.  Appetite and hydration are good. Weight is stable at 169 lbs.   ECOG Performance Status: 1 - Symptomatic but completely ambulatory  Medications:  Allergies as of 11/16/2024   No Known Allergies      Medication List        Accurate as of November 16, 2024  8:44 AM. If you have any questions, ask your nurse or doctor.          lisinopril  2.5 MG tablet Commonly known as: ZESTRIL  Take 1 tablet (2.5 mg total) by mouth daily.   loratadine 10 MG tablet Commonly known as: CLARITIN Take 10 mg by mouth daily.   MOTRIN PO Take by mouth as needed.   multivitamin tablet Take 1 tablet by mouth daily.        Allergies: Allergies[1]  Past Medical History, Surgical history, Social history, and Family History were reviewed and updated.  Review of Systems: All other 10 point review of systems is negative.   Physical Exam:  height is 5' 8 (1.727 m) and weight is 169 lb (76.7 kg). His oral temperature is 97.7 F (36.5 C). His blood pressure is 134/81 and his pulse is 56 (abnormal). His respiration is 17 and oxygen saturation is 100%.   Wt Readings from Last 3 Encounters:  11/16/24 169 lb (76.7 kg)  11/06/24 172 lb 3.2 oz (78.1 kg)  05/15/24  168 lb (76.2 kg)    Ocular: Sclerae unicteric, pupils equal, round and reactive to light Ear-nose-throat: Oropharynx clear, dentition fair Lymphatic: No cervical or supraclavicular adenopathy Lungs no rales or rhonchi, good excursion bilaterally Heart regular rate and rhythm, no murmur appreciated Abd soft, nontender, positive bowel sounds MSK no focal spinal tenderness, no joint edema Neuro: non-focal, well-oriented, appropriate affect Breasts: Deferred   Lab Results  Component Value Date   WBC 3.7 (L) 11/16/2024   HGB 15.9 11/16/2024   HCT 46.2 11/16/2024   MCV 88.5 11/16/2024   PLT 166 11/16/2024   Lab Results  Component Value Date   FERRITIN 32 05/15/2024   IRON 95 05/15/2024   TIBC 361 05/15/2024   UIBC 266 05/15/2024   IRONPCTSAT 26 05/15/2024   Lab Results  Component Value Date   RETICCTPCT 1.8 11/16/2023   RBC 5.22 11/16/2024   No results found for: KPAFRELGTCHN, LAMBDASER, KAPLAMBRATIO No results found for: IGGSERUM, IGA, IGMSERUM No results found for: STEPHANY CARLOTA BENSON MARKEL EARLA JOANNIE DOC VICK, SPEI   Chemistry      Component Value Date/Time   NA 138 11/06/2024 0817   K 3.8 11/06/2024 0817   CL 100 11/06/2024 0817   CO2 31 11/06/2024 0817   BUN 19 11/06/2024 0817   CREATININE 1.05 11/06/2024 0817  CREATININE 1.26 (H) 05/15/2024 0825   CREATININE 1.05 09/12/2020 0840      Component Value Date/Time   CALCIUM 9.9 11/06/2024 0817   ALKPHOS 82 11/06/2024 0817   AST 28 11/06/2024 0817   AST 23 05/15/2024 0825   ALT 35 11/06/2024 0817   ALT 29 05/15/2024 0825   BILITOT 0.6 11/06/2024 0817   BILITOT 0.7 05/15/2024 0825       Impression and Plan: Chase Roy is a 39 yo caucasian gentleman with hemochromatosis, homozygous for the major C282Y mutation.  Iron studies are pending.  [facility].  Follow-up in 6 months.   Lauraine Pepper, NP 1/9/20268:44  AM     [1] No Known Allergies  "

## 2025-05-24 ENCOUNTER — Inpatient Hospital Stay: Admitting: Family

## 2025-05-24 ENCOUNTER — Inpatient Hospital Stay
